# Patient Record
Sex: Male | Born: 1960
Health system: Southern US, Community
[De-identification: ages and names within clinical notes are randomized; demographics above are authoritative.]

## PROBLEM LIST (undated history)

## (undated) DIAGNOSIS — I10 Essential (primary) hypertension: Secondary | ICD-10-CM

## (undated) HISTORY — DX: Essential (primary) hypertension: I10

---

## 2013-10-13 HISTORY — PX: COLONOSCOPY: SHX5424

## 2015-06-13 ENCOUNTER — Ambulatory Visit
Admission: RE | Admit: 2015-06-13 | Discharge: 2015-06-13 | Disposition: A | Discharge status: Home / Self Care | Payer: Worker's Compensation | Source: Ambulatory Visit | Attending: Family Medicine | Admitting: Family Medicine

## 2015-06-13 ENCOUNTER — Other Ambulatory Visit: Payer: Self-pay | Admitting: Family Medicine

## 2015-06-13 DIAGNOSIS — M25571 Pain in right ankle and joints of right foot: Secondary | ICD-10-CM | POA: Diagnosis not present

## 2015-06-13 DIAGNOSIS — M25471 Effusion, right ankle: Secondary | ICD-10-CM | POA: Diagnosis present

## 2015-06-13 DIAGNOSIS — M7731 Calcaneal spur, right foot: Secondary | ICD-10-CM | POA: Diagnosis not present

## 2016-03-25 ENCOUNTER — Ambulatory Visit
Admission: RE | Admit: 2016-03-25 | Discharge: 2016-03-25 | Disposition: A | Discharge status: Home / Self Care | Payer: BLUE CROSS/BLUE SHIELD | Source: Ambulatory Visit | Attending: Family Medicine | Admitting: Family Medicine

## 2016-03-25 ENCOUNTER — Other Ambulatory Visit: Payer: Self-pay | Admitting: Family Medicine

## 2016-03-25 DIAGNOSIS — G629 Polyneuropathy, unspecified: Secondary | ICD-10-CM

## 2016-03-25 DIAGNOSIS — M47892 Other spondylosis, cervical region: Secondary | ICD-10-CM | POA: Insufficient documentation

## 2017-01-21 ENCOUNTER — Inpatient Hospital Stay: Payer: BLUE CROSS/BLUE SHIELD | Attending: Internal Medicine | Admitting: Internal Medicine

## 2017-02-13 ENCOUNTER — Encounter: Payer: Self-pay | Admitting: Internal Medicine

## 2017-03-28 IMAGING — CR DG ANKLE COMPLETE 3+V*R*
3 series · 3 of 3 positions shown · non-contrast
Comparison: None.

CLINICAL DATA: Rolled ankle today.  Lateral pain.

EXAM:
RIGHT ANKLE - COMPLETE 3+ VIEW

[ankle ap]
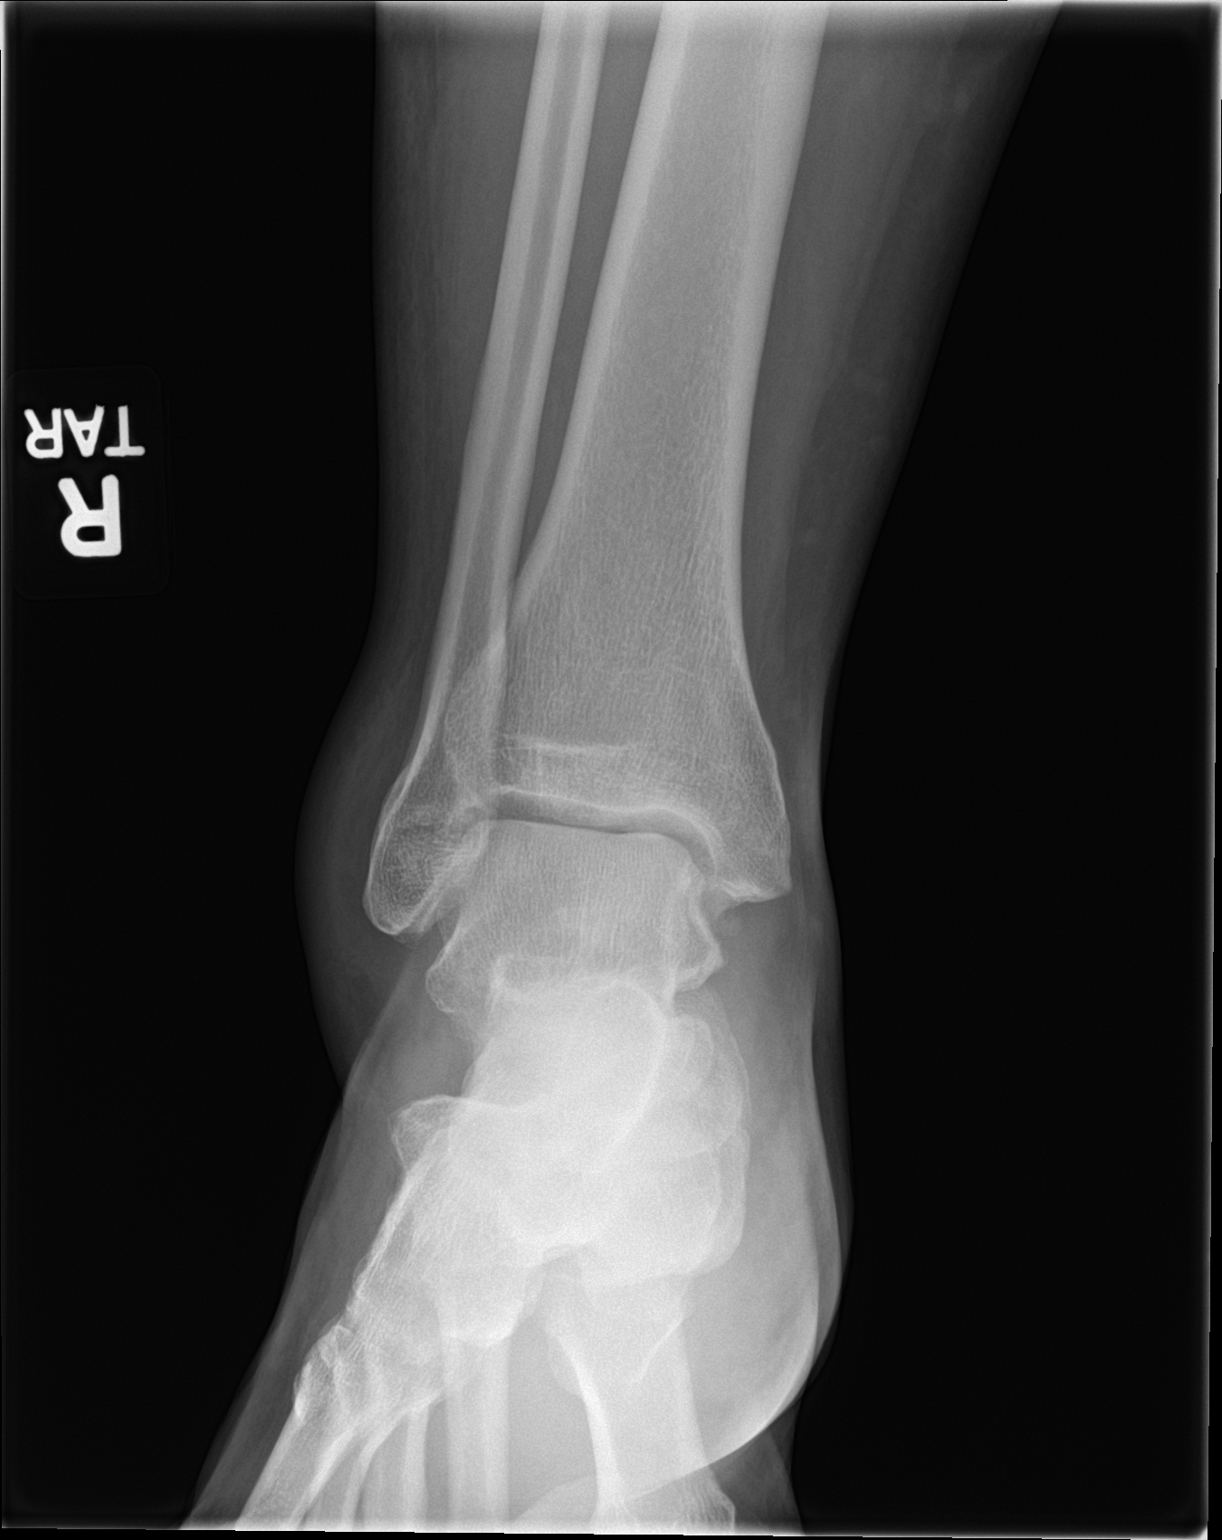

[ankle obl]
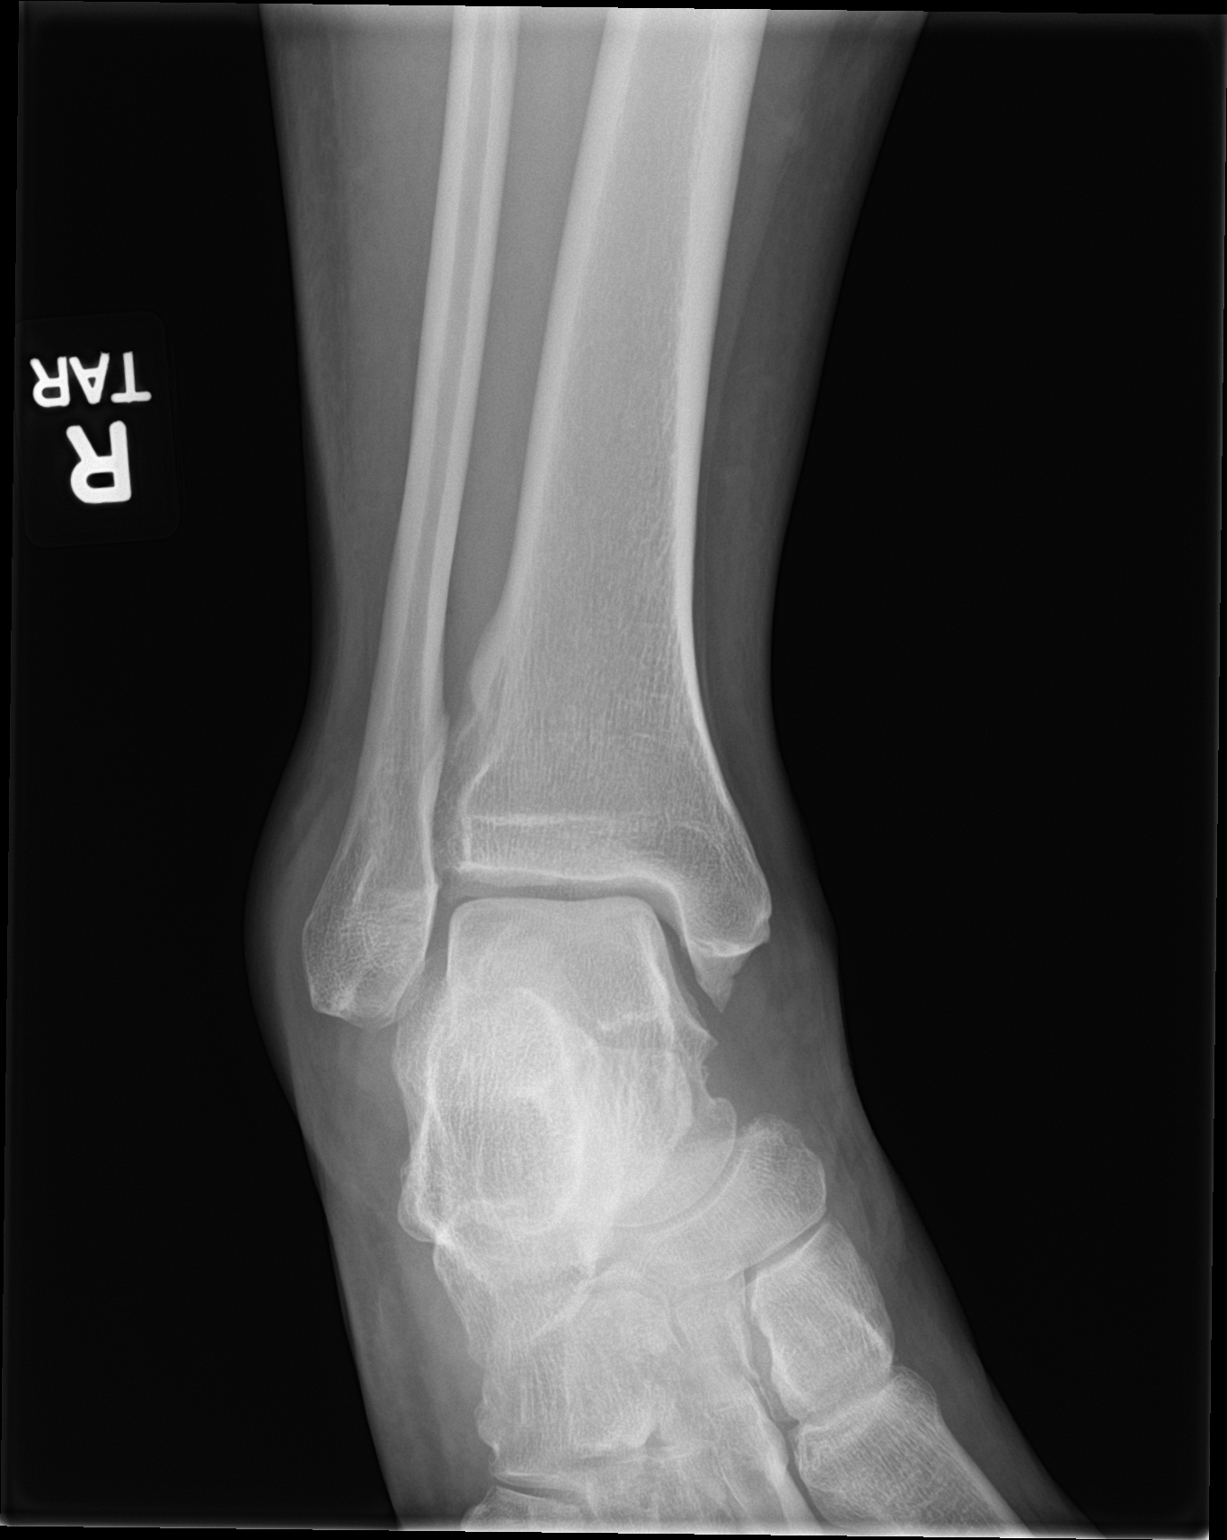

[ankle lat]
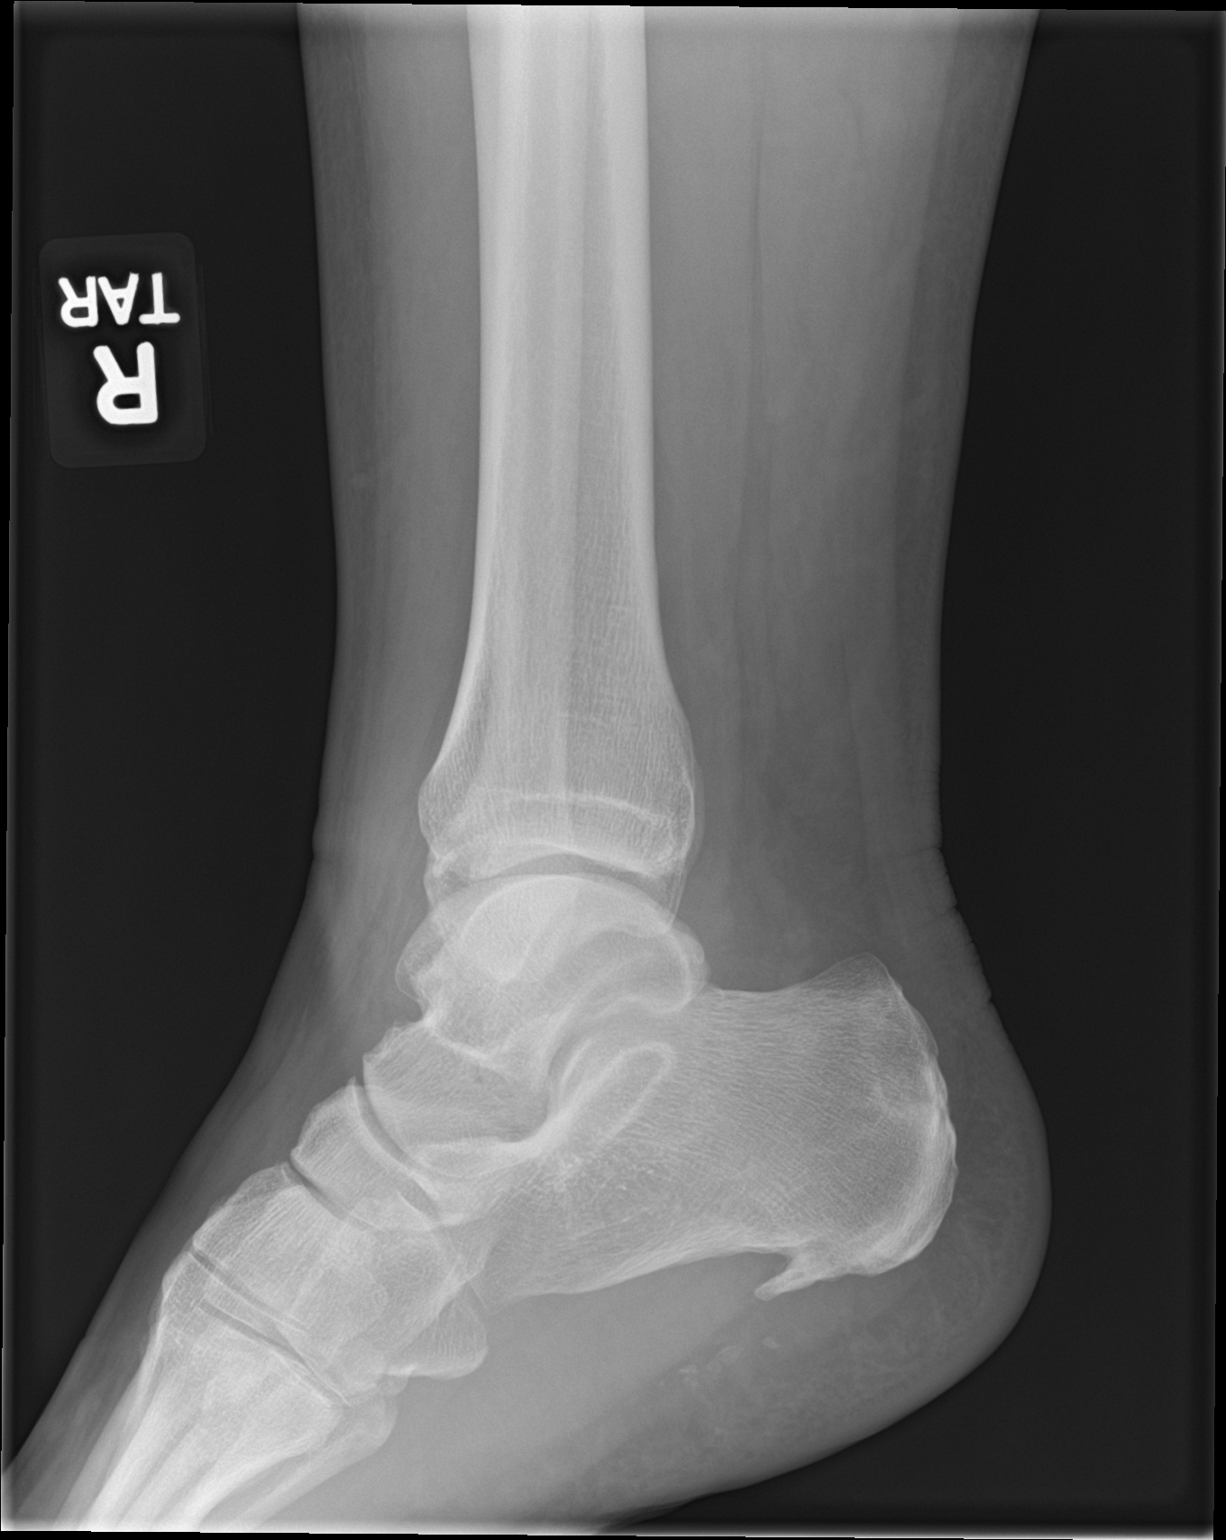

[3 of 3 positions shown; findings below may reference images not displayed]

FINDINGS: Lateral soft tissue swelling. No acute bony abnormality.
Specifically, no fracture, subluxation, or dislocation. Soft tissues
are intact. Plantar calcaneal spur.
IMPRESSION: Lateral soft tissue swelling.  No acute bony abnormality.

## 2017-06-16 DIAGNOSIS — G608 Other hereditary and idiopathic neuropathies: Secondary | ICD-10-CM | POA: Insufficient documentation

## 2018-01-08 IMAGING — CR DG CERVICAL SPINE COMPLETE 4+V
1 series · 6 of 6 positions shown · non-contrast
Comparison: None.

CLINICAL DATA: Neuropathy.  Right hand pain.

EXAM:
CERVICAL SPINE - COMPLETE 4+ VIEW

[Series 1: dg cervical spine complete · 0.14mm/px · 6 of 6 slices shown]
[im 1/6]
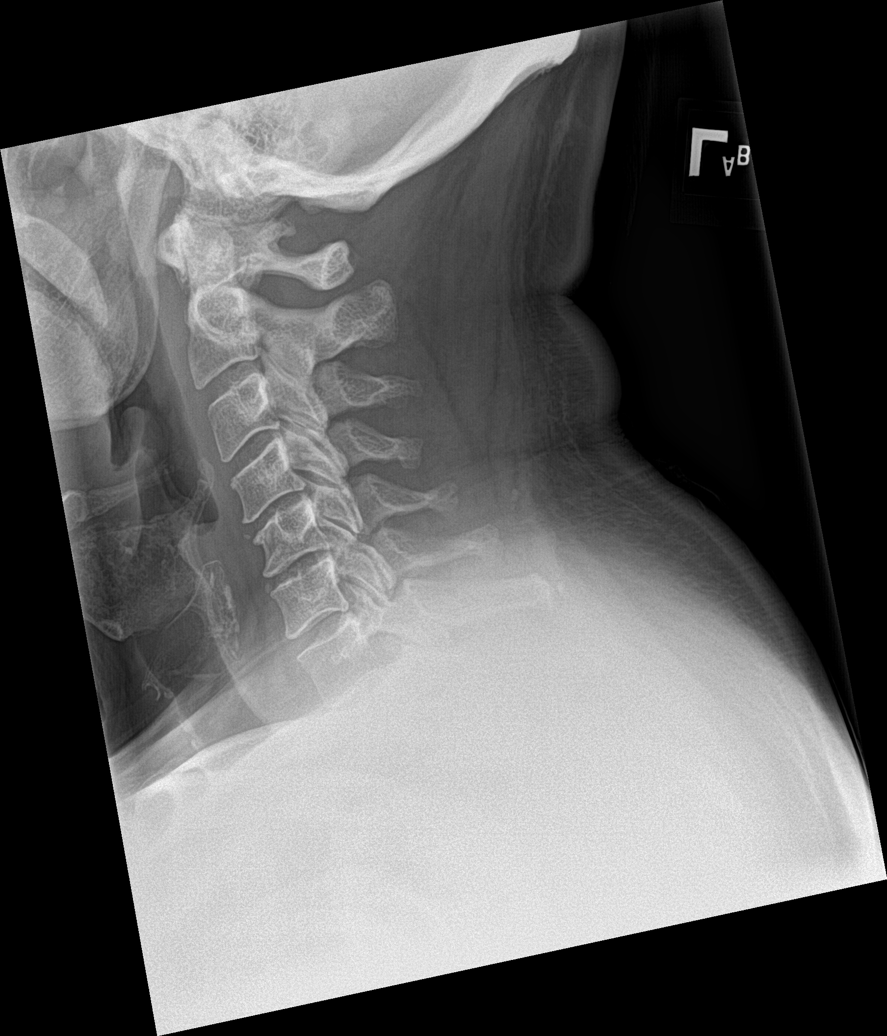
[im 2/6]
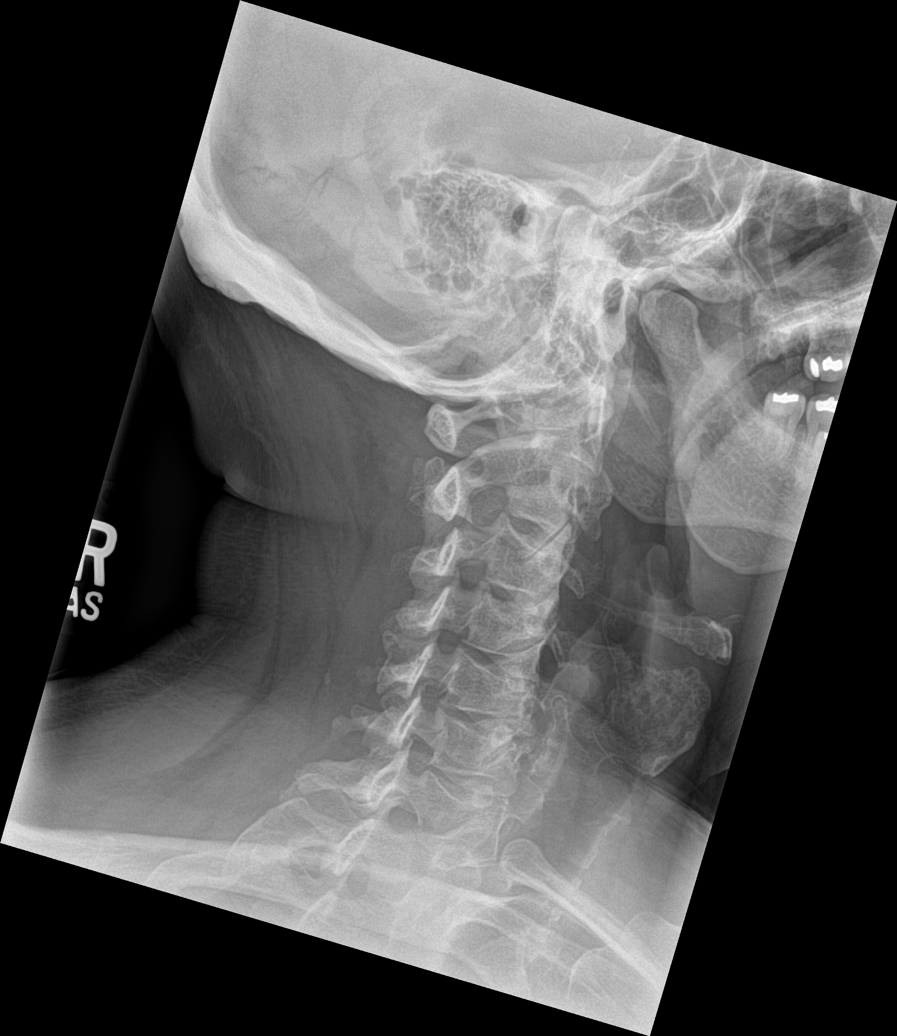
[im 3/6]
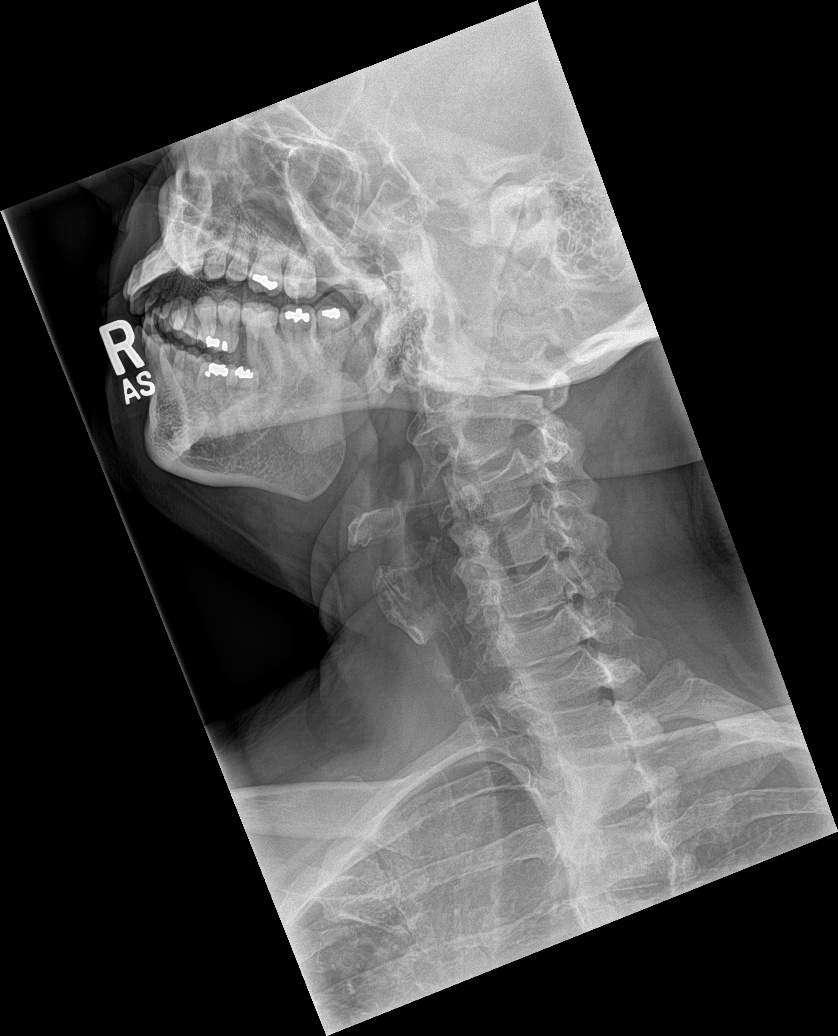
[im 4/6]
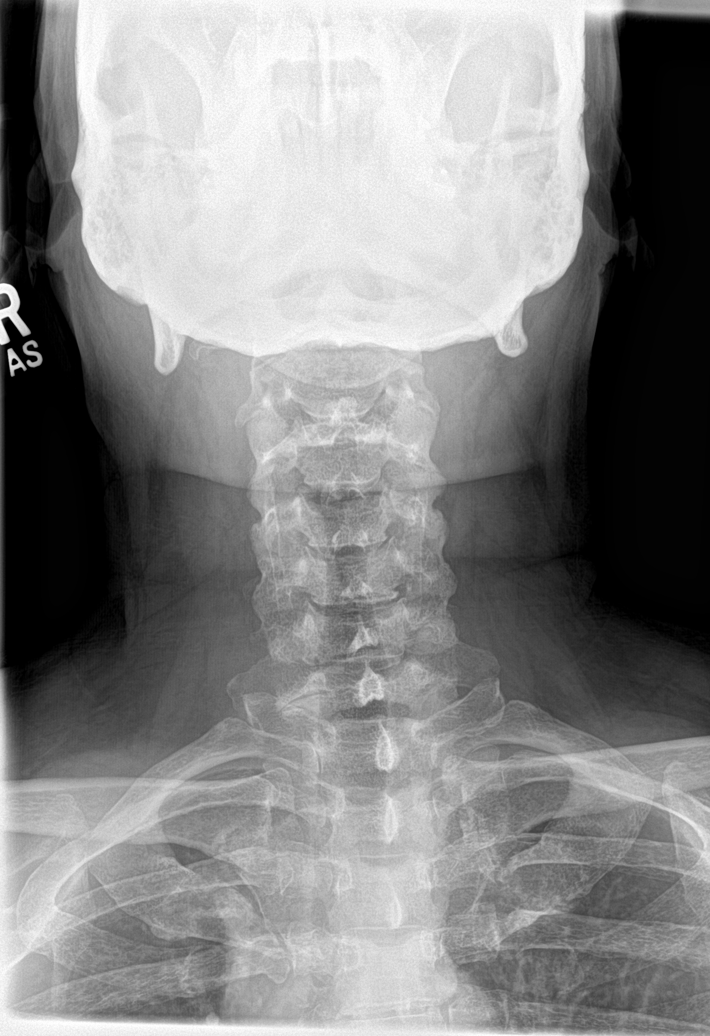
[im 5/6]
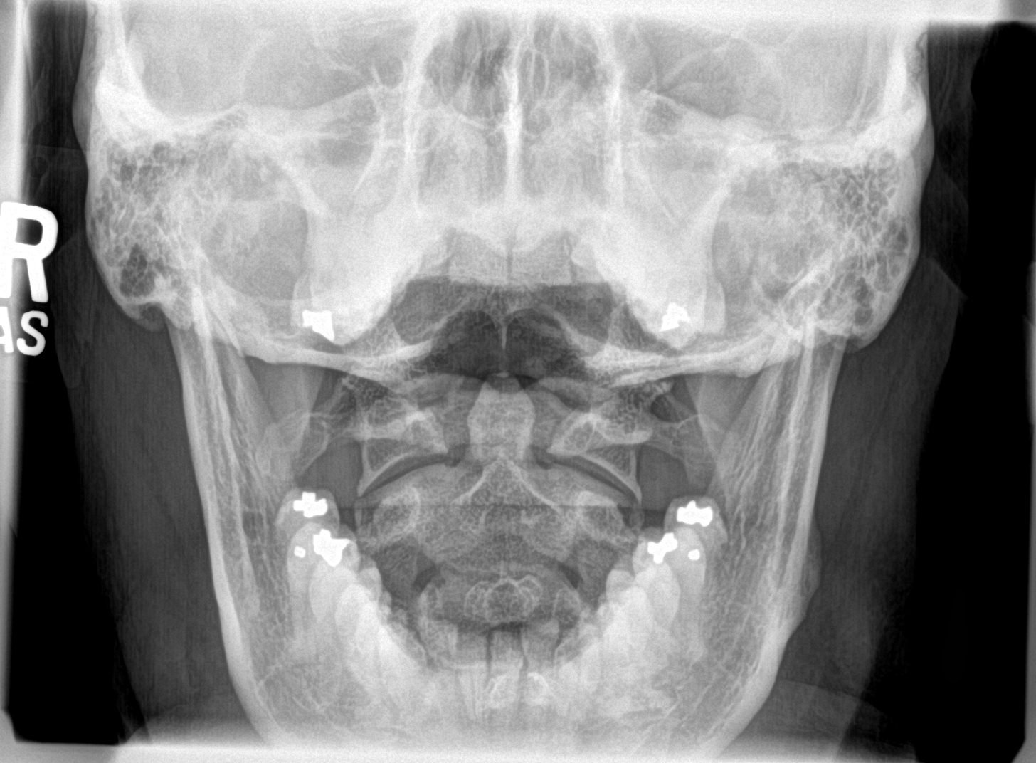
[im 6/6]
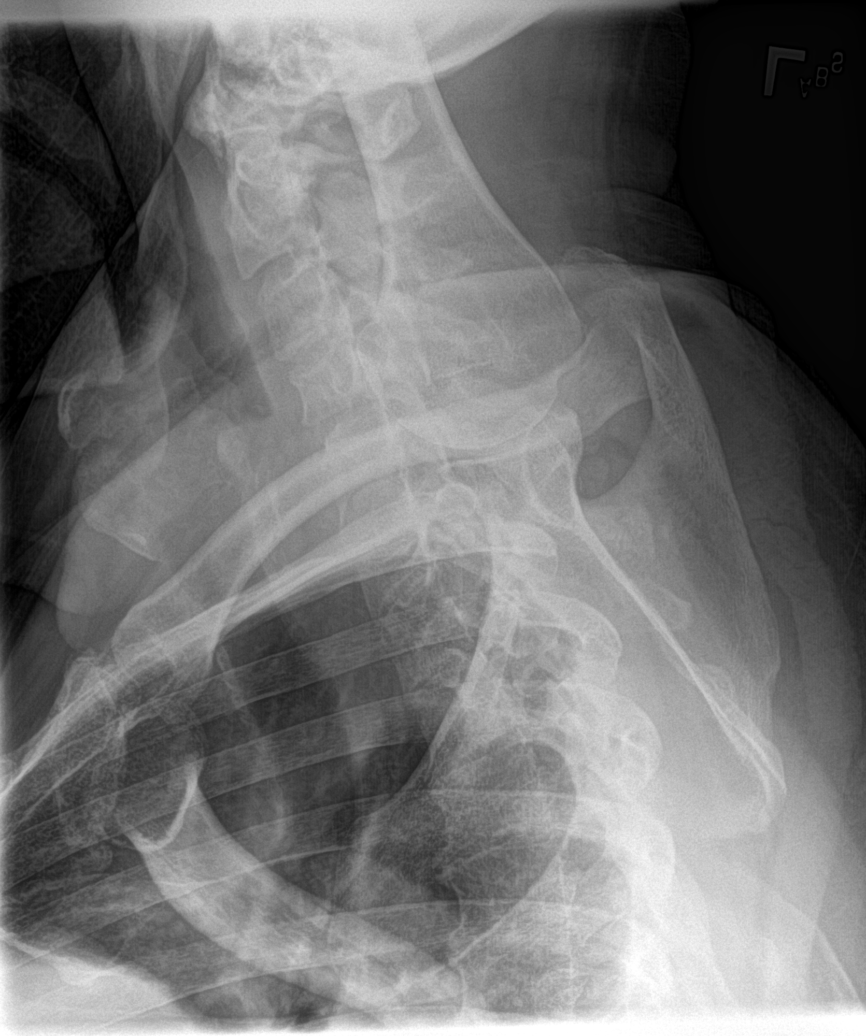

[6 of 6 positions shown; findings below may reference images not displayed]

FINDINGS: Early degenerative disc disease changes at C4-5 and C5-6 with disc
space narrowing and anterior spurring. Normal alignment. No
fracture. Prevertebral soft tissues are normal.
IMPRESSION: Early degenerative changes.  No acute findings.

## 2018-09-01 ENCOUNTER — Other Ambulatory Visit: Payer: Self-pay

## 2018-09-01 MED ORDER — ESZOPICLONE 3 MG PO TABS: 3.0000 mg | ORAL_TABLET | Freq: Every evening | ORAL | 0 refills | Status: DC | PRN

## 2018-09-28 DIAGNOSIS — G608 Other hereditary and idiopathic neuropathies: Secondary | ICD-10-CM | POA: Diagnosis not present

## 2019-02-10 ENCOUNTER — Other Ambulatory Visit: Payer: Self-pay

## 2019-02-10 DIAGNOSIS — G4700 Insomnia, unspecified: Secondary | ICD-10-CM

## 2019-02-10 NOTE — Telephone Encounter (Signed)
Noted  

## 2019-02-10 NOTE — Telephone Encounter (Signed)
Placed call with PT to explain that he would need to be seen before we could refill his Eszopiclone. He hasn't been seen in office since 08/2017. When ask when PT would like to schedule he refused. Stating his reason was fear of covid. I then spoke with Jerelyn Scott NP and explained his concerns, she said that he must be seen in office since he hasn't been here in so long. I then explained to the PT that we would be unable to fill his RX at this time without being seen. I informed him to call back at anytime if he would like to schedule an appt to be seen.

## 2019-02-10 NOTE — Telephone Encounter (Signed)
Joel refused to schedule a visit with a provider at the [facility].  Gerarda Fraction, NP-C (Interim Provider) refused to refill the Rx.  States he needs to come to the clinic every 3 months for face to face visit for controlled substance.  AMD

## 2019-02-10 NOTE — Telephone Encounter (Signed)
Last appt with Dr Cheryll Cockayne physical 08/25/2017 Rx lunesta prn.  Saw Dr Roxan Hockey 05/06/2018 for sinusitis.  Dr Roxan Hockey did medication refill eszopiclone 3mg  po qhs prn #30 Rf0 on 09/01/2018.  Dr Cheryll Cockayne filled prior to that 08/25/2017 #30   Last labs 08/19/2017. Sees neurology at [facility] last appt office 06/03/2017 telemedicine 09/28/2018 no labs drawn since 2018.   Reviewed [place] PMP website 7 Rx previous 2 years by [facility] providers only.  Controlled substances require face to face visits every 3 months.  No controlled substances agreement on file in paper chart.  Patient to schedule appt with provider since Dr Roxan Hockey no longer working in clinic.  Unisom available OTC.

## 2019-03-23 ENCOUNTER — Ambulatory Visit: Payer: Managed Care, Other (non HMO) | Attending: Internal Medicine

## 2019-03-23 DIAGNOSIS — Z20822 Contact with and (suspected) exposure to covid-19: Secondary | ICD-10-CM | POA: Diagnosis not present

## 2019-03-24 ENCOUNTER — Telehealth: Payer: Self-pay | Admitting: *Deleted

## 2019-03-24 NOTE — Telephone Encounter (Signed)
Patient called for results ,still pending also assisted  with my chart

## 2019-03-25 LAB — NOVEL CORONAVIRUS, NAA: SARS-CoV-2, NAA: NOT DETECTED

## 2019-08-26 NOTE — Progress Notes (Signed)
Scheduled to complete physical 09/02/19 with Ron Smith, PA-C.  AMD 

## 2019-08-27 ENCOUNTER — Ambulatory Visit: Payer: Self-pay

## 2019-08-27 ENCOUNTER — Other Ambulatory Visit: Payer: Self-pay

## 2019-08-27 DIAGNOSIS — Z0184 Encounter for antibody response examination: Secondary | ICD-10-CM

## 2019-08-27 DIAGNOSIS — Z0000 Encounter for general adult medical examination without abnormal findings: Secondary | ICD-10-CM

## 2019-08-27 LAB — POCT URINALYSIS DIPSTICK
Bilirubin, UA: NEGATIVE
Blood, UA: NEGATIVE
Glucose, UA: NEGATIVE
Ketones, UA: NEGATIVE
Leukocytes, UA: NEGATIVE
Nitrite, UA: NEGATIVE
Protein, UA: NEGATIVE
Spec Grav, UA: 1.01 (ref 1.010–1.025)
Urobilinogen, UA: 0.2 E.U./dL
pH, UA: 7.5 (ref 5.0–8.0)

## 2019-09-02 ENCOUNTER — Encounter: Payer: Self-pay | Admitting: Physician Assistant

## 2019-09-02 ENCOUNTER — Ambulatory Visit: Payer: Self-pay | Admitting: Physician Assistant

## 2019-09-02 ENCOUNTER — Other Ambulatory Visit: Payer: Self-pay

## 2019-09-02 VITALS — BP 158/100 | HR 78 | Temp 97.9°F | Resp 16 | Ht 70.0 in | Wt 232.0 lb

## 2019-09-02 DIAGNOSIS — Z0000 Encounter for general adult medical examination without abnormal findings: Secondary | ICD-10-CM

## 2019-09-02 MED ORDER — ESZOPICLONE 1 MG PO TABS: 1.0000 mg | ORAL_TABLET | Freq: Every evening | ORAL | 0 refills | Status: DC | PRN

## 2019-09-02 NOTE — Progress Notes (Signed)
   Subjective: Annual physical exam    Patient ID: Marcus Riley, male    DOB: Jul 24, 1960, 59 y.o.   MRN: [id number]  HPI Patient presents for annual physical.  Patient requests refill of Lunesta.   Review of Systems Insomnia    Objective:   Physical Exam No acute distress.  HEENT is unremarkable.  Neck is supple without adenopathy or bruits.  Lungs are clear to auscultation.  Heart regular rate and rhythm.  Abdomen with negative HSM, normoactive bowel sounds, soft, and nontender palpation.  No obvious deformity to the upper or lower extremities.  Patient has free and equal range of motion of the upper and lower extremities.  No obvious deformity to the cervical or lumbar spine.  Patient has full equal range of motion of cervical lumbar spine.  Cranial nerves II through XII grossly intact.      Assessment & Plan: Well exam.  Discussed lab results with patient.  Prescription for Alfonso Patten was renewed.  Follow-up as needed.

## 2019-09-20 LAB — CMP12+LP+TP+TSH+6AC+PSA+CBC…
ALT: 22 IU/L (ref 0–44)
AST: 22 IU/L (ref 0–40)
Albumin/Globulin Ratio: 2.1 (ref 1.2–2.2)
Albumin: 4.4 g/dL (ref 3.8–4.9)
Alkaline Phosphatase: 118 IU/L (ref 48–121)
BUN/Creatinine Ratio: 13 (ref 9–20)
BUN: 12 mg/dL (ref 6–24)
Basophils Absolute: 0 10*3/uL (ref 0.0–0.2)
Basos: 1 %
Bilirubin Total: 0.4 mg/dL (ref 0.0–1.2)
Calcium: 10 mg/dL (ref 8.7–10.2)
Chloride: 104 mmol/L (ref 96–106)
Chol/HDL Ratio: 4.1 ratio (ref 0.0–5.0)
Cholesterol, Total: 181 mg/dL (ref 100–199)
Creatinine, Ser: 0.89 mg/dL (ref 0.76–1.27)
EOS (ABSOLUTE): 0.3 10*3/uL (ref 0.0–0.4)
Eos: 4 %
Estimated CHD Risk: 0.8 times avg. (ref 0.0–1.0)
Free Thyroxine Index: 1.9 (ref 1.2–4.9)
GFR calc Af Amer: 110 mL/min/{1.73_m2} (ref >59)
GFR calc non Af Amer: 95 mL/min/{1.73_m2} (ref >59)
GGT: 56 IU/L (ref 0–65)
Globulin, Total: 2.1 g/dL (ref 1.5–4.5)
Glucose: 93 mg/dL (ref 65–99)
HDL: 44 mg/dL (ref >39)
Hematocrit: 44.8 % (ref 37.5–51.0)
Hemoglobin: 14.9 g/dL (ref 13.0–17.7)
Immature Grans (Abs): 0 10*3/uL (ref 0.0–0.1)
Immature Granulocytes: 0 %
Iron: 122 ug/dL (ref 38–169)
LDH: 156 IU/L (ref 121–224)
LDL Chol Calc (NIH): 118 mg/dL — ABNORMAL HIGH (ref 0–99)
Lymphocytes Absolute: 1.8 10*3/uL (ref 0.7–3.1)
Lymphs: 27 %
MCH: 28.5 pg (ref 26.6–33.0)
MCHC: 33.3 g/dL (ref 31.5–35.7)
MCV: 86 fL (ref 79–97)
Monocytes Absolute: 0.7 10*3/uL (ref 0.1–0.9)
Monocytes: 11 %
Neutrophils Absolute: 3.8 10*3/uL (ref 1.4–7.0)
Neutrophils: 57 %
Phosphorus: 2.8 mg/dL (ref 2.8–4.1)
Platelets: 201 10*3/uL (ref 150–450)
Potassium: 4.4 mmol/L (ref 3.5–5.2)
Prostate Specific Ag, Serum: 4.3 ng/mL — ABNORMAL HIGH (ref 0.0–4.0)
RBC: 5.23 x10E6/uL (ref 4.14–5.80)
RDW: 12.9 % (ref 11.6–15.4)
Sodium: 138 mmol/L (ref 134–144)
T3 Uptake Ratio: 28 % (ref 24–39)
T4, Total: 6.8 ug/dL (ref 4.5–12.0)
TSH: 1.86 u[IU]/mL (ref 0.450–4.500)
Total Protein: 6.5 g/dL (ref 6.0–8.5)
Triglycerides: 102 mg/dL (ref 0–149)
Uric Acid: 6.7 mg/dL (ref 3.8–8.4)
VLDL Cholesterol Cal: 19 mg/dL (ref 5–40)
WBC: 6.7 10*3/uL (ref 3.4–10.8)

## 2019-09-20 LAB — RABIES NEUT.ABS TITRAT.(RFFIT): Rabies Titer - Response: <0.1 IU/mL

## 2019-09-30 DIAGNOSIS — G608 Other hereditary and idiopathic neuropathies: Secondary | ICD-10-CM | POA: Diagnosis not present

## 2019-10-18 ENCOUNTER — Other Ambulatory Visit: Payer: Self-pay

## 2019-10-18 DIAGNOSIS — Z1152 Encounter for screening for COVID-19: Secondary | ICD-10-CM

## 2019-10-18 NOTE — Progress Notes (Signed)
Presents for covid screening.  S/Sx started over the weekend: Body aches Fatigue Slight cough  Has Mychart  AMD

## 2019-10-19 ENCOUNTER — Telehealth: Payer: Self-pay

## 2019-10-19 LAB — NOVEL CORONAVIRUS, NAA: SARS-CoV-2, NAA: NOT DETECTED

## 2019-10-19 LAB — SARS-COV-2, NAA 2 DAY TAT

## 2019-10-19 NOTE — Telephone Encounter (Signed)
Contacted Marcus Riley on home phone number after attempting to contact him on mobile number.  Informed him that covid test results are negative.  AMD

## 2019-12-06 ENCOUNTER — Other Ambulatory Visit: Payer: Self-pay

## 2019-12-06 DIAGNOSIS — Z1152 Encounter for screening for COVID-19: Secondary | ICD-10-CM

## 2019-12-06 NOTE — Progress Notes (Signed)
Presents to [facility] for outside specimen collection for covid testing.  Symtoms started yesterday (12/05/19) Head congestion Sneezing  Coughing  No known exposure  Non-vaccinated  Has mychart  AMD

## 2019-12-07 LAB — SARS-COV-2, NAA 2 DAY TAT

## 2019-12-07 LAB — NOVEL CORONAVIRUS, NAA: SARS-CoV-2, NAA: NOT DETECTED

## 2019-12-08 ENCOUNTER — Other Ambulatory Visit: Payer: Self-pay

## 2019-12-08 DIAGNOSIS — G4700 Insomnia, unspecified: Secondary | ICD-10-CM

## 2019-12-08 MED ORDER — ESZOPICLONE 1 MG PO TABS: 1.0000 mg | ORAL_TABLET | Freq: Every evening | ORAL | 3 refills | Status: DC | PRN

## 2019-12-27 ENCOUNTER — Other Ambulatory Visit: Payer: Self-pay

## 2019-12-27 ENCOUNTER — Ambulatory Visit: Payer: Self-pay

## 2019-12-27 DIAGNOSIS — Z23 Encounter for immunization: Secondary | ICD-10-CM

## 2019-12-27 MED ORDER — RABIES VACCINE, PCEC IM SUSR
1.0000 mL | Freq: Once | INTRAMUSCULAR | Status: AC
  Administered 2019-12-27: 1 mL via INTRAMUSCULAR

## 2019-12-27 NOTE — Progress Notes (Signed)
Julis had Rabies titer on 08/27/19.  Results were <0.1 IU/mL Comment:  Less than 0.1 IU/mL:  Below detection limit >/= 0.1 IU/mL  Documentation obtained from paper chart in [facility] clinic. Guillaume received Rabies pre-exposure vaccine 01/13/12, 01/22/12 & 02/03/12 while working part-time for [employer]. Rabies titer checked 01/27/14 with results of >/= 0.1 IU/mL  (>/= 0.1 IU/mL: Above detection limit but below 0.5 IU/mL)  Presents to clinic today for Rabies booster.  AMD

## 2020-02-02 ENCOUNTER — Telehealth: Payer: Self-pay

## 2020-02-02 NOTE — Telephone Encounter (Signed)
Marcus Riley called today stating that he can't his medication until 02/21/20 per the pharmacy.  Tried to do a PA through [employer] & it said one wasn't required.  Called [employer] at [phone] & spoke with CSR Yvonna Alanis) who said there is a time restriction on medication without a PA.  (30 tabs q 45 days)  Lunesta 1 mg 1 tab po hs Qty 30  Prior Authorization approved for 36 months starting today.  Pharmacy may initiate a Rx  02/02/20 - 02/02/23  ICD code - G47.09 - Insomnia  [employer] ID # [id number] Group # [id number]  AMD

## 2020-02-04 ENCOUNTER — Telehealth: Payer: Self-pay

## 2020-02-04 NOTE — Telephone Encounter (Signed)
Tried to call Marcus Riley to let him know that PA for Lunesta completed two days ago & message sent to him through Mychart.  He didn't answer the call & he doesn't have voice mail set up so unable to leave a call back message.  AMD

## 2020-03-14 ENCOUNTER — Other Ambulatory Visit: Payer: Self-pay

## 2020-03-14 DIAGNOSIS — Z1152 Encounter for screening for COVID-19: Secondary | ICD-10-CM

## 2020-03-16 LAB — NOVEL CORONAVIRUS, NAA: SARS-CoV-2, NAA: NOT DETECTED

## 2020-03-16 LAB — SARS-COV-2, NAA 2 DAY TAT

## 2020-03-20 ENCOUNTER — Other Ambulatory Visit: Payer: Self-pay

## 2020-03-20 ENCOUNTER — Ambulatory Visit: Payer: Self-pay | Admitting: Nurse Practitioner

## 2020-03-20 ENCOUNTER — Encounter: Payer: Self-pay | Admitting: Nurse Practitioner

## 2020-03-20 ENCOUNTER — Other Ambulatory Visit: Payer: Self-pay | Admitting: Nurse Practitioner

## 2020-03-20 VITALS — BP 149/103 | HR 104 | Temp 98.4°F | Resp 16 | Ht 70.0 in | Wt 220.0 lb

## 2020-03-20 DIAGNOSIS — R062 Wheezing: Secondary | ICD-10-CM

## 2020-03-20 DIAGNOSIS — J189 Pneumonia, unspecified organism: Secondary | ICD-10-CM

## 2020-03-20 MED ORDER — ALBUTEROL SULFATE HFA 108 (90 BASE) MCG/ACT IN AERS: 2.0000 | INHALATION_SPRAY | Freq: Four times a day (QID) | RESPIRATORY_TRACT | 0 refills | Status: DC | PRN

## 2020-03-20 MED ORDER — AZITHROMYCIN 250 MG PO TABS: ORAL_TABLET | ORAL | 0 refills | Status: DC

## 2020-03-20 MED ORDER — AMOXICILLIN 500 MG PO CAPS: 1000.0000 mg | ORAL_CAPSULE | Freq: Three times a day (TID) | ORAL | 0 refills | Status: AC

## 2020-03-20 NOTE — Progress Notes (Signed)
Phone call with patient:  Patient states:  Last week head cold COVID PCR at [facility] negative   Cold progressed to chest   Fatigue  Cough + blood in am  Advised patient to come to clinic for appointment today

## 2020-03-20 NOTE — Progress Notes (Signed)
Subjective:    Patient ID: Marcus Riley, male    DOB: [birth year], 60 y.o.   MRN: [id number]  HPI  60 year old male presenting with one week of cold symptoms. Started with head congestion, was tested for COVID at that time (03/14/20) and was negative.   Over the past week congestion has moved more to his chest. He is now coughing up dark mucous that is blood tinged.   Cough is more persistent in the am, he has been using Catering manager cold/flu OTC for support of symptoms   Denies history of respiratory illnesses/denies asthma or use of inhalers in the past   Works in [employer]   Vitals:   03/20/20 1321  BP: (!) 149/103  Pulse: (!) 104  Resp: 16  Temp: 98.4 F (36.9 C)  SpO2: 95%    Repeat BP taken with manual cuff on right arm while patient sitting.  145/88      Review of Systems  Constitutional: Positive for fatigue.  HENT: Positive for congestion.   Respiratory: Positive for cough.   Cardiovascular: Negative.   Genitourinary: Negative.   Psychiatric/Behavioral: Negative.    Past Medical History:  Diagnosis Date  . Hypertension       Objective:   Physical Exam HENT:     Head: Normocephalic.     Right Ear: Tympanic membrane normal.     Left Ear: Tympanic membrane normal.     Nose: Nose normal.  Cardiovascular:     Rate and Rhythm: Normal rate.     Heart sounds: Normal heart sounds.  Pulmonary:     Breath sounds: Examination of the right-upper field reveals wheezing and rhonchi. Examination of the left-upper field reveals wheezing, rhonchi and rales. Examination of the right-middle field reveals rhonchi. Examination of the left-middle field reveals rhonchi and rales. Examination of the left-lower field reveals decreased breath sounds and rales. Decreased breath sounds, wheezing, rhonchi and rales present.     Comments: Inspiratory rales to left lung fields Expiratory wheezing throughout upper lung fields Diminished lung sounds to left  base Scattered rhonchi throughout   No acute distress  Musculoskeletal:     Cervical back: Neck supple.  Neurological:     Mental Status: He is alert.       Recent Results (from the past 2160 hour(s))  Novel Coronavirus, NAA ([facility])     Status: None   Collection Time: 03/14/20  1:48 PM   Specimen: Nasopharyngeal(NP) swabs in vial transport medium   Nasopharynge  Result Value Ref Range   SARS-CoV-2, NAA Not Detected Not Detected    Comment: This nucleic acid amplification test was developed and its performance characteristics determined by [employer]. Nucleic acid amplification tests include RT-PCR and TMA. This test has not been FDA cleared or approved. This test has been authorized by FDA under an Emergency Use Authorization (EUA). This test is only authorized for the duration of time the declaration that circumstances exist justifying the authorization of the emergency use of in vitro diagnostic tests for detection of SARS-CoV-2 virus and/or diagnosis of COVID-19 infection under section 564(b)(1) of the Act, 21 U.S.C. 245YKD-9(I) (1), unless the authorization is terminated or revoked sooner. When diagnostic testing is negative, the possibility of a false negative result should be considered in the context of a patient's recent exposures and the presence of clinical signs and symptoms consistent with COVID-19. An individual without symptoms of COVID-19 and who is not shedding SARS-CoV-2 virus wo uld expect  to have a negative (not detected) result in this assay.   SARS-COV-2, NAA 2 DAY TAT     Status: None   Collection Time: 03/14/20  1:48 PM   Nasopharynge  Result Value Ref Range   SARS-CoV-2, NAA 2 DAY TAT Performed       Assessment & Plan:  F/u 03/23/20 re evaluate BP and respiratory symptoms may send for XRAY at that time if no improvement at that time as discussed with patient  Advised stopping Alka Seltzer, switch to OTC Mucinex to help with mucous  production Push fluids rest RTC earlier with new or worsening symptoms as discussed  Encouraged balanced diet with protein at each meal and eating with antibiotics to prevent stomach upset or diarrhea   Meds ordered this encounter  Medications  . albuterol (VENTOLIN HFA) 108 (90 Base) MCG/ACT inhaler    Sig: Inhale 2 puffs into the lungs every 6 (six) hours as needed for wheezing or shortness of breath (every 4-6 hours as needed. RInse mouth after use).    Dispense:  8 g    Refill:  0  . amoxicillin (AMOXIL) 500 MG capsule    Sig: Take 2 capsules (1,000 mg total) by mouth 3 (three) times daily for 7 days.    Dispense:  42 capsule    Refill:  0  . azithromycin (ZITHROMAX) 250 MG tablet    Sig: Take 2 tablets on day 1 then take one tablet daily on days 2-5. Take with food    Dispense:  6 tablet    Refill:  0

## 2020-03-23 ENCOUNTER — Ambulatory Visit: Payer: Self-pay | Admitting: Physician Assistant

## 2020-03-23 ENCOUNTER — Encounter: Payer: Self-pay | Admitting: Physician Assistant

## 2020-03-23 ENCOUNTER — Other Ambulatory Visit: Payer: Self-pay

## 2020-03-23 VITALS — BP 144/98 | HR 70 | Temp 97.3°F | Resp 16 | Ht 70.0 in | Wt 222.0 lb

## 2020-03-23 DIAGNOSIS — J189 Pneumonia, unspecified organism: Secondary | ICD-10-CM

## 2020-03-23 NOTE — Progress Notes (Signed)
Spoke with Marcus Riley by phone.  Informed him Dr. Fran Lowes left me a know to make a GI referral for him for a Colonoscopy.  Champ states he had a Colonoscopy in 2016 & was supposed to repeat in 2021, but was unable to schedule due to Covid.  States [facility] performed the last one at the [facility] and that [facility] has reached out to him.  Seng states that he doesn't want the GI referral at this time.  Durward Parcel, PA-C & Pricilla Handler, MD both notified.  AMD

## 2020-03-23 NOTE — Patient Instructions (Signed)
Needs f/u colonoscope due to hx

## 2020-03-23 NOTE — Progress Notes (Signed)
Covid test 03/14/20 & reported as negative on 03/16/20.  Recheck from Monday's appt - Dx'd with Pheumonia & Bronchitis Taking Zpack (tomorrow is last dose) & Amoxicillin  Feels some better, but still low energy Coughing fits - especially at night No bloody mucus now Still has some wheezing but feels it's better.  Electronic BP = 160/97 Manual BP = 144/98

## 2020-03-23 NOTE — Progress Notes (Signed)
   Subjective:    Patient ID: Marcus Riley, male    DOB: March 23, 1960, 60 y.o.   MRN: [id number]  HPI 60 year old male a follow-up for acute bronchitis and pneumonia afebrile today having difficulty with sleeping denies cough during the day as a generally stated to be improving x-ray was considered at last visit patient defers x-ray at this time until follow-up examination probably last consider last Covid testing on 12/21 - Pertinent improved appetite reduced cough history denies fever chills   Review of Systems Review of systems unchanged's since 03/21/2019 visit   Medications prior provider Zithromax amoxicillin staggered 5-day in 10-day course History of hypertension reportedly followed by Dr.Shaw which patient would like to keep as primary physician working with him on blood pressure History of tubular adenoma resection in 2015 patient was recommended for follow-up colonoscopy in 3 to 5-year interval is due Objective:   Physical Exam blood pressure 144/98 T 97.3  HEENT examination is unremarkable within normal limits Pulmonary bronchial breath sounds with rhonchi anterior posterior left greater than right, vocal fremitus present with A/E change right middle lobe posteriorly clears with forced expiratory cough, present forced expiratory wheeze without significantly prolonged expiratory phase Cardiac PMI midaxillary line NSR without murmur, ectopy   Abdominal  protuberant bowel sounds present no organomegaly  Extremities unremarkable Neuro deferred exam history of of polyneuropathy followed by neurologist  Assessment & Plan:  Dr. Margart Sickles exam/summary in discussion and consistent with with patient's wishes to defer recommended x-ray will continue current course and await f/u result. He prefers  to maintain therapy with neurologist for blood pressure as well as peripheral neuropathy Discussed with patient to use albuterol inhaler twice daily and as needed if short of breath at night if significant  worsens occurs over weekend to use ER Desires to continue current regimen follow-up Tuesday. He will discuss further need x-ray or medication follow-up depending on condition at that time

## 2020-06-27 ENCOUNTER — Other Ambulatory Visit: Payer: Self-pay

## 2020-06-27 DIAGNOSIS — J069 Acute upper respiratory infection, unspecified: Secondary | ICD-10-CM | POA: Diagnosis not present

## 2020-06-27 DIAGNOSIS — Z1152 Encounter for screening for COVID-19: Secondary | ICD-10-CM

## 2020-06-27 NOTE — Progress Notes (Signed)
Presents to [facility] for outdoor specimen collection for covid test.  Symptomatic since 06/26/20: Cough Congestion Sneezing Runny nose  Non-vaccinated  Has MyChart  AMD

## 2020-06-28 LAB — SARS-COV-2, NAA 2 DAY TAT

## 2020-06-28 LAB — NOVEL CORONAVIRUS, NAA: SARS-CoV-2, NAA: NOT DETECTED

## 2020-08-15 ENCOUNTER — Other Ambulatory Visit: Payer: Self-pay

## 2020-08-15 DIAGNOSIS — Z1152 Encounter for screening for COVID-19: Secondary | ICD-10-CM

## 2020-08-15 LAB — POC COVID19 BINAXNOW: SARS Coronavirus 2 Ag: NEGATIVE

## 2020-08-15 LAB — POCT INFLUENZA A/B
Influenza A, POC: NEGATIVE
Influenza B, POC: NEGATIVE

## 2020-08-15 NOTE — Progress Notes (Signed)
Pt presents today for COVID, symptoms started yesterday 08/14/20, loose bowels, temp, chills and body aches and fatigue. Pt wife just got done with her quarantine. CL,RMA

## 2020-08-16 LAB — NOVEL CORONAVIRUS, NAA: SARS-CoV-2, NAA: NOT DETECTED

## 2020-08-16 LAB — SARS-COV-2, NAA 2 DAY TAT

## 2020-08-31 ENCOUNTER — Other Ambulatory Visit: Payer: Self-pay

## 2020-08-31 ENCOUNTER — Ambulatory Visit: Payer: Self-pay

## 2020-08-31 DIAGNOSIS — Z0000 Encounter for general adult medical examination without abnormal findings: Secondary | ICD-10-CM

## 2020-08-31 LAB — POCT URINALYSIS DIPSTICK
Bilirubin, UA: NEGATIVE
Blood, UA: NEGATIVE
Glucose, UA: NEGATIVE
Ketones, UA: NEGATIVE
Leukocytes, UA: NEGATIVE
Nitrite, UA: NEGATIVE
Protein, UA: NEGATIVE
Spec Grav, UA: 1.015 (ref 1.010–1.025)
Urobilinogen, UA: 0.2 E.U./dL
pH, UA: 6 (ref 5.0–8.0)

## 2020-08-31 NOTE — Progress Notes (Signed)
EKG reviewed by A. Scarboro, NP-C - No changes.  AMD

## 2020-08-31 NOTE — Progress Notes (Signed)
Scheduled to complete physical 09/08/20 with Durward Parcel, PA-C.  AMD

## 2020-09-01 LAB — CMP12+LP+TP+TSH+6AC+PSA+CBC…
ALT: 23 IU/L (ref 0–44)
AST: 26 IU/L (ref 0–40)
Albumin/Globulin Ratio: 2.4 — ABNORMAL HIGH (ref 1.2–2.2)
Albumin: 4.4 g/dL (ref 3.8–4.9)
Alkaline Phosphatase: 100 IU/L (ref 44–121)
BUN/Creatinine Ratio: 14 (ref 9–20)
BUN: 12 mg/dL (ref 6–24)
Basophils Absolute: 0 10*3/uL (ref 0.0–0.2)
Basos: 0 %
Bilirubin Total: 0.5 mg/dL (ref 0.0–1.2)
Calcium: 9.7 mg/dL (ref 8.7–10.2)
Chloride: 99 mmol/L (ref 96–106)
Chol/HDL Ratio: 3.5 ratio (ref 0.0–5.0)
Cholesterol, Total: 163 mg/dL (ref 100–199)
Creatinine, Ser: 0.88 mg/dL (ref 0.76–1.27)
EOS (ABSOLUTE): 0.2 10*3/uL (ref 0.0–0.4)
Eos: 3 %
Estimated CHD Risk: 0.6 times avg. (ref 0.0–1.0)
Free Thyroxine Index: 2 (ref 1.2–4.9)
GGT: 61 IU/L (ref 0–65)
Globulin, Total: 1.8 g/dL (ref 1.5–4.5)
Glucose: 82 mg/dL (ref 65–99)
HDL: 46 mg/dL (ref >39)
Hematocrit: 38.6 % (ref 37.5–51.0)
Hemoglobin: 12.9 g/dL — ABNORMAL LOW (ref 13.0–17.7)
Immature Grans (Abs): 0 10*3/uL (ref 0.0–0.1)
Immature Granulocytes: 0 %
Iron: 115 ug/dL (ref 38–169)
LDH: 155 IU/L (ref 121–224)
LDL Chol Calc (NIH): 100 mg/dL — ABNORMAL HIGH (ref 0–99)
Lymphocytes Absolute: 1.5 10*3/uL (ref 0.7–3.1)
Lymphs: 29 %
MCH: 28.4 pg (ref 26.6–33.0)
MCHC: 33.4 g/dL (ref 31.5–35.7)
MCV: 85 fL (ref 79–97)
Monocytes Absolute: 0.5 10*3/uL (ref 0.1–0.9)
Monocytes: 9 %
Neutrophils Absolute: 3 10*3/uL (ref 1.4–7.0)
Neutrophils: 59 %
Phosphorus: 2.9 mg/dL (ref 2.8–4.1)
Platelets: 183 10*3/uL (ref 150–450)
Potassium: 4.2 mmol/L (ref 3.5–5.2)
Prostate Specific Ag, Serum: 1.4 ng/mL (ref 0.0–4.0)
RBC: 4.55 x10E6/uL (ref 4.14–5.80)
RDW: 13.2 % (ref 11.6–15.4)
Sodium: 134 mmol/L (ref 134–144)
T3 Uptake Ratio: 28 % (ref 24–39)
T4, Total: 7.1 ug/dL (ref 4.5–12.0)
TSH: 1.85 u[IU]/mL (ref 0.450–4.500)
Total Protein: 6.2 g/dL (ref 6.0–8.5)
Triglycerides: 93 mg/dL (ref 0–149)
Uric Acid: 6.2 mg/dL (ref 3.8–8.4)
VLDL Cholesterol Cal: 17 mg/dL (ref 5–40)
WBC: 5.2 10*3/uL (ref 3.4–10.8)
eGFR: 100 mL/min/{1.73_m2} (ref >59)

## 2020-09-08 ENCOUNTER — Ambulatory Visit: Payer: Self-pay | Admitting: Physician Assistant

## 2020-09-08 ENCOUNTER — Encounter: Payer: Self-pay | Admitting: Physician Assistant

## 2020-09-08 ENCOUNTER — Other Ambulatory Visit: Payer: Self-pay

## 2020-09-08 VITALS — BP 158/93 | HR 70 | Temp 97.3°F | Resp 16 | Ht 70.0 in | Wt 224.0 lb

## 2020-09-08 DIAGNOSIS — Z1211 Encounter for screening for malignant neoplasm of colon: Secondary | ICD-10-CM

## 2020-09-08 DIAGNOSIS — Z0000 Encounter for general adult medical examination without abnormal findings: Secondary | ICD-10-CM

## 2020-09-08 DIAGNOSIS — G4700 Insomnia, unspecified: Secondary | ICD-10-CM

## 2020-09-08 DIAGNOSIS — R933 Abnormal findings on diagnostic imaging of other parts of digestive tract: Secondary | ICD-10-CM

## 2020-09-08 MED ORDER — FEXOFENADINE-PSEUDOEPHED ER 60-120 MG PO TB12: 1.0000 | ORAL_TABLET | Freq: Two times a day (BID) | ORAL | 0 refills | Status: DC

## 2020-09-08 MED ORDER — METHYLPREDNISOLONE 4 MG PO TBPK: ORAL_TABLET | ORAL | 0 refills | Status: DC

## 2020-09-08 MED ORDER — ESZOPICLONE 1 MG PO TABS: 1.0000 mg | ORAL_TABLET | Freq: Every evening | ORAL | 3 refills | Status: DC | PRN

## 2020-09-08 NOTE — Addendum Note (Signed)
Addended by: Gardner Candle on: 09/08/2020 04:57 PM   Modules accepted: Orders

## 2020-09-08 NOTE — Progress Notes (Signed)
Feels like he's tasting infection Tx'd for pneumonia 03/2020 Tx'd 06/2020 by [facility] - no ABX, but was prescribed cough meds that he didn't use. Has never been referred to ENT. Denies SOB & chest comfort. States feels like the congestion is his head  Colonoscopy was 09/2013 by [facility] - was supposed to repeat it in 5 years. Is overdue due to Covid.  Reviewed Hep C & HIV CDD recommendations for screening - declined at this time, but will consider with next year's labs for physical  Declined both Covid & Shingles vaccinations.  AMD

## 2020-09-08 NOTE — Progress Notes (Signed)
   Subjective: Annual physical exam    Patient ID: Marcus Riley, male    DOB: 08/18/1960, 60 y.o.   MRN: [id number]  HPI Patient presents for annual physical exam.  Patient was concerned for intermittent sinus congestion.  Patient states also the complaint was after an episode of pneumonia earlier this year.  Patient also requests refill of Lunesta for his insomnia.   Review of Systems Sensorimotor polyneuropathy and insomnia.    Objective:   Physical Exam No acute distress.  Temperature 97.3, pulse is 70, respirations 16, BP is 144/90, and patient is 90% O2 sat on room air. HEENT is unremarkable except for bilateral maxillary and postnasal drainage.  Neck is supple without adenopathy or bruits.  Lungs are clear to auscultation.  Heart is regular rate and rhythm.  EKG shows LAFB. Negative HSM, normoactive bowel sounds, soft, nontender to palpation. No obvious deformity to the upper or lower extremities.  Patient has full and equal range of motion of the upper or lower extremities. No obvious cervical or lumbar spine deformity.  Patient has full and equal range of motion of the cervical and lumbar spine. Cranial nerves II through XII are grossly intact.  DTRs are 2+ without clonus.       Assessment & Plan: Well exam.  Discussed patient lab results.  Patient also has environmental versus seasonal rhinitis.  Patient given a prescription for Allegra-D and Medrol Dosepak.  Patient prescription for Alfonso Patten was refilled.  Patient will follow-up in 7 to 10 days for reevaluation of rhinitis.

## 2020-09-19 ENCOUNTER — Ambulatory Visit: Payer: Self-pay | Admitting: Physician Assistant

## 2020-09-19 ENCOUNTER — Encounter: Payer: Self-pay | Admitting: Physician Assistant

## 2020-09-19 ENCOUNTER — Other Ambulatory Visit: Payer: Self-pay

## 2020-09-19 VITALS — BP 155/101 | HR 74 | Temp 97.6°F | Resp 14 | Ht 70.0 in | Wt 220.0 lb

## 2020-09-19 DIAGNOSIS — R030 Elevated blood-pressure reading, without diagnosis of hypertension: Secondary | ICD-10-CM

## 2020-09-19 MED ORDER — FEXOFENADINE HCL 60 MG PO TABS: 60.0000 mg | ORAL_TABLET | Freq: Two times a day (BID) | ORAL | 0 refills | Status: DC

## 2020-09-19 MED ORDER — AMOXICILLIN 875 MG PO TABS: 875.0000 mg | ORAL_TABLET | Freq: Two times a day (BID) | ORAL | 0 refills | Status: AC

## 2020-09-19 NOTE — Progress Notes (Signed)
Pt stated he completed course of sterioids and didn't help with the "nasal swelling" and has started to have a cough Sunday 09/17/20. CL,RMA

## 2020-09-19 NOTE — Progress Notes (Signed)
   Subjective: Sinus congestion    Patient ID: Marcus Riley, male    DOB: [birth year], 60 y.o.   MRN: [id number]  HPI Patient presents with 2 weeks of sinus congestion.  Complaint was refractory to steroids, antihistamine, decongestant prescribed on his last visit.  Patient states continues to have a postnasal drainage.  Denies fever associated with complaint   Review of Systems Negative except for complaint.    Objective:   Physical Exam No acute distress.  Temperature 97.6, pulse 74, respiration 14, BP is 155/101, and O2 sat is 97% on on room air. HEENT is remarkable postnasal drainage.  Neck is supple without adenopathy or bruits.  Lungs are clear to auscultation.  Heart is regular rate and rhythm.       Assessment & Plan: Subacute maxillary sinusitis and elevated blood pressure.   Patient advised to have a 3-day blood pressure check.  Patient given a prescription for amoxicillin and Allegra.  Patient will follow-up in 1 week.  If no improvement consult to ENT.

## 2020-09-28 DIAGNOSIS — G608 Other hereditary and idiopathic neuropathies: Secondary | ICD-10-CM | POA: Diagnosis not present

## 2020-12-07 DIAGNOSIS — Z86010 Personal history of colonic polyps: Secondary | ICD-10-CM | POA: Diagnosis not present

## 2020-12-11 ENCOUNTER — Other Ambulatory Visit: Payer: Self-pay | Admitting: Physician Assistant

## 2020-12-11 DIAGNOSIS — J020 Streptococcal pharyngitis: Secondary | ICD-10-CM

## 2020-12-11 DIAGNOSIS — Z1152 Encounter for screening for COVID-19: Secondary | ICD-10-CM

## 2020-12-11 LAB — POC COVID19 BINAXNOW: SARS Coronavirus 2 Ag: NEGATIVE

## 2020-12-11 LAB — POCT RAPID STREP A (OFFICE): Rapid Strep A Screen: NEGATIVE

## 2020-12-11 NOTE — Progress Notes (Signed)
Pt experiencing sore throat, chest congestion 12/10/20. RAPID Strep: Negative RAPID COVID: Negative PCR COVID: Pending

## 2020-12-12 LAB — SARS-COV-2, NAA 2 DAY TAT

## 2020-12-12 LAB — NOVEL CORONAVIRUS, NAA: SARS-CoV-2, NAA: NOT DETECTED

## 2020-12-25 ENCOUNTER — Encounter: Payer: Self-pay | Admitting: Physician Assistant

## 2020-12-25 ENCOUNTER — Other Ambulatory Visit: Payer: Self-pay

## 2020-12-25 ENCOUNTER — Ambulatory Visit: Payer: Self-pay | Admitting: Physician Assistant

## 2020-12-25 ENCOUNTER — Telehealth: Payer: Self-pay

## 2020-12-25 VITALS — BP 160/86 | HR 62 | Temp 97.7°F | Resp 14 | Ht 70.0 in | Wt 225.0 lb

## 2020-12-25 DIAGNOSIS — R0981 Nasal congestion: Secondary | ICD-10-CM

## 2020-12-25 DIAGNOSIS — J0100 Acute maxillary sinusitis, unspecified: Secondary | ICD-10-CM

## 2020-12-25 MED ORDER — FEXOFENADINE-PSEUDOEPHED ER 60-120 MG PO TB12: 1.0000 | ORAL_TABLET | Freq: Two times a day (BID) | ORAL | 0 refills | Status: DC

## 2020-12-25 MED ORDER — AMOXICILLIN 875 MG PO TABS: 875.0000 mg | ORAL_TABLET | Freq: Two times a day (BID) | ORAL | 0 refills | Status: AC

## 2020-12-25 NOTE — Progress Notes (Signed)
S/Sx started on 12/10/20 - sore throat & chest congestion Rapid Strep test - Negative; Rapid Covid - Negative; PCR Covid - Negative  Called today stating still having symptoms & requesting to be seen by provider. Also requested ENT referral as previously discussed with Durward Parcel, PA-C. Referral made to Dr. Erline Hau of [facility].  Advised Marcus Riley that ENT office will contact him to schedule appt.  S/Sx: Headache with facial pain/pressure Productive Cough & Nasal Drainage - Grey/Green/Yellow Ears click when blows his nose No teeth discomfort Sore throat - Resolved  OTC Meds: Tried generic Claritin for a couple of days last week, but didn't think it was working & stopped taking it. Has been taking generic guaifenesin tablests (12 hour) with minimal relief.  AMD

## 2020-12-25 NOTE — Telephone Encounter (Signed)
Called clinic requesting ENT Referral. States he & Durward Parcel, PA-C discussed ENT referral at his last visit. Said he feels he has another sinus infection & wants to go ahead & see an ENT specialist.  Referral put in for [facility].  AMD

## 2020-12-25 NOTE — Progress Notes (Signed)
   Subjective: Sinus congestion    Patient ID: Marcus Riley, male    DOB: [birth year], 60 y.o.   MRN: [id number]  HPI Patient presents for over 2 weeks of sinus congestion.  Patient also complain of facial pain, frontal headache, and a thick greenish nasal discharge.  Patient denies fever chills associated complaint.  Patient tested negative for COVID-19.  Patient has taken the vaccine boosters.  No recent travel or known contact with COVID-19.   Review of Systems Seasonal rhinitis    Objective:   Physical Exam No acute distress.  Temperature 97.7, pulse 71, respiration 14, BP is 160/109.  Patient 90% O2 sat on room air       Assessment & Plan:   Patient presents for greater than 2 weeks of sinus congestion, frontal headache, facial pain, and thick greenish nasal drainage.  Patient complaint physical exam consistent with subacute maxillary sinusitis.  Patient given discharge care instruction prescription for amoxicillin and Allegra-D.  Patient will follow-up with schedule ENT appointment next week.

## 2020-12-27 DIAGNOSIS — J309 Allergic rhinitis, unspecified: Secondary | ICD-10-CM | POA: Diagnosis not present

## 2020-12-27 DIAGNOSIS — J301 Allergic rhinitis due to pollen: Secondary | ICD-10-CM | POA: Diagnosis not present

## 2020-12-27 DIAGNOSIS — J329 Chronic sinusitis, unspecified: Secondary | ICD-10-CM | POA: Diagnosis not present

## 2021-01-15 ENCOUNTER — Other Ambulatory Visit: Payer: Self-pay

## 2021-01-15 ENCOUNTER — Encounter: Payer: Self-pay | Admitting: Physician Assistant

## 2021-01-15 ENCOUNTER — Ambulatory Visit: Payer: 59 | Admitting: Physician Assistant

## 2021-01-15 DIAGNOSIS — J069 Acute upper respiratory infection, unspecified: Secondary | ICD-10-CM

## 2021-01-15 DIAGNOSIS — Z1152 Encounter for screening for COVID-19: Secondary | ICD-10-CM

## 2021-01-15 LAB — POC COVID19 BINAXNOW: SARS Coronavirus 2 Ag: NEGATIVE

## 2021-01-15 MED ORDER — PSEUDOEPH-BROMPHEN-DM 30-2-10 MG/5ML PO SYRP: 5.0000 mL | ORAL_SOLUTION | Freq: Four times a day (QID) | ORAL | 0 refills | Status: DC | PRN

## 2021-01-15 MED ORDER — AZITHROMYCIN 250 MG PO TABS: ORAL_TABLET | ORAL | 0 refills | Status: AC

## 2021-01-15 NOTE — Progress Notes (Signed)
   Subjective: Respiratory infection    Patient ID: Marcus Riley, male    DOB: 07-29-1960, 60 y.o.   MRN: [id number]  HPI Patient presents with productive cough and chest congestion.  Patient has a COVID PCR pending.  Denies fever, chills, body aches, or malaise.  Immunizations are up-to-date for COVID and influenza.   Review of Systems Seasonal rhinitis and mix sensory/motor neuropathy.    Objective:   Physical Exam This is a virtual visit.  Review pictures of expectorant with coughing.       Assessment & Plan: Respiratory infection.   Advised quarantine pending PCR results.  Patient given a prescription for Bromfed-DM and Z-Pak.

## 2021-01-15 NOTE — Progress Notes (Signed)
Pt finished round of antibiotics and still coughing up thick yellow mucous. Re-testing for covid.

## 2021-01-18 DIAGNOSIS — J339 Nasal polyp, unspecified: Secondary | ICD-10-CM | POA: Diagnosis not present

## 2021-01-18 DIAGNOSIS — J329 Chronic sinusitis, unspecified: Secondary | ICD-10-CM | POA: Diagnosis not present

## 2021-01-18 LAB — SARS-COV-2, NAA 2 DAY TAT

## 2021-01-18 LAB — NOVEL CORONAVIRUS, NAA: SARS-CoV-2, NAA: NOT DETECTED

## 2021-02-15 HISTORY — PX: COLONOSCOPY: SHX174

## 2021-03-14 DIAGNOSIS — D123 Benign neoplasm of transverse colon: Secondary | ICD-10-CM | POA: Diagnosis not present

## 2021-03-14 DIAGNOSIS — Z86010 Personal history of colonic polyps: Secondary | ICD-10-CM | POA: Diagnosis not present

## 2021-03-14 DIAGNOSIS — K635 Polyp of colon: Secondary | ICD-10-CM | POA: Diagnosis not present

## 2021-03-14 DIAGNOSIS — K648 Other hemorrhoids: Secondary | ICD-10-CM | POA: Diagnosis not present

## 2021-03-14 DIAGNOSIS — D126 Benign neoplasm of colon, unspecified: Secondary | ICD-10-CM | POA: Diagnosis not present

## 2021-03-14 DIAGNOSIS — D122 Benign neoplasm of ascending colon: Secondary | ICD-10-CM | POA: Diagnosis not present

## 2021-04-26 ENCOUNTER — Other Ambulatory Visit: Payer: Self-pay

## 2021-04-26 DIAGNOSIS — G4700 Insomnia, unspecified: Secondary | ICD-10-CM

## 2021-04-26 MED ORDER — ESZOPICLONE 1 MG PO TABS: 1.0000 mg | ORAL_TABLET | Freq: Every evening | ORAL | 3 refills | Status: DC | PRN

## 2021-09-03 ENCOUNTER — Ambulatory Visit: Payer: Self-pay

## 2021-09-03 ENCOUNTER — Other Ambulatory Visit: Payer: Self-pay | Admitting: Physician Assistant

## 2021-09-03 DIAGNOSIS — Z0184 Encounter for antibody response examination: Secondary | ICD-10-CM

## 2021-09-03 DIAGNOSIS — G629 Polyneuropathy, unspecified: Secondary | ICD-10-CM

## 2021-09-03 DIAGNOSIS — Z0000 Encounter for general adult medical examination without abnormal findings: Secondary | ICD-10-CM

## 2021-09-03 LAB — POCT URINALYSIS DIPSTICK
Bilirubin, UA: NEGATIVE
Blood, UA: NEGATIVE
Glucose, UA: NEGATIVE
Ketones, UA: NEGATIVE
Leukocytes, UA: NEGATIVE
Nitrite, UA: NEGATIVE
Protein, UA: NEGATIVE
Spec Grav, UA: 1.015 (ref 1.010–1.025)
Urobilinogen, UA: 0.2 E.U./dL
pH, UA: 8 (ref 5.0–8.0)

## 2021-09-03 NOTE — Progress Notes (Signed)
Pt presents today for physical labs. Will return to clinic for scheduled physical.  Performed by Wandra Feinstein

## 2021-09-04 LAB — CMP12+LP+TP+TSH+6AC+PSA+CBC…
ALT: 22 IU/L (ref 0–44)
AST: 21 IU/L (ref 0–40)
Albumin/Globulin Ratio: 2.3 — ABNORMAL HIGH (ref 1.2–2.2)
Albumin: 4.3 g/dL (ref 3.8–4.9)
Alkaline Phosphatase: 105 IU/L (ref 44–121)
BUN/Creatinine Ratio: 14 (ref 9–20)
BUN: 11 mg/dL (ref 6–24)
Basophils Absolute: 0 10*3/uL (ref 0.0–0.2)
Basos: 1 %
Bilirubin Total: 0.2 mg/dL (ref 0.0–1.2)
Calcium: 10 mg/dL (ref 8.7–10.2)
Chloride: 107 mmol/L — ABNORMAL HIGH (ref 96–106)
Chol/HDL Ratio: 2.9 ratio (ref 0.0–5.0)
Cholesterol, Total: 157 mg/dL (ref 100–199)
Creatinine, Ser: 0.78 mg/dL (ref 0.76–1.27)
EOS (ABSOLUTE): 0.3 10*3/uL (ref 0.0–0.4)
Eos: 6 %
Estimated CHD Risk: <0.5 times avg. (ref 0.0–1.0)
Free Thyroxine Index: 1.6 (ref 1.2–4.9)
GGT: 56 IU/L (ref 0–65)
Globulin, Total: 1.9 g/dL (ref 1.5–4.5)
Glucose: 93 mg/dL (ref 70–99)
HDL: 55 mg/dL (ref >39)
Hematocrit: 42.9 % (ref 37.5–51.0)
Hemoglobin: 14.5 g/dL (ref 13.0–17.7)
Immature Grans (Abs): 0 10*3/uL (ref 0.0–0.1)
Immature Granulocytes: 0 %
Iron: 90 ug/dL (ref 38–169)
LDH: 160 IU/L (ref 121–224)
LDL Chol Calc (NIH): 92 mg/dL (ref 0–99)
Lymphocytes Absolute: 1.5 10*3/uL (ref 0.7–3.1)
Lymphs: 32 %
MCH: 29.1 pg (ref 26.6–33.0)
MCHC: 33.8 g/dL (ref 31.5–35.7)
MCV: 86 fL (ref 79–97)
Monocytes Absolute: 0.5 10*3/uL (ref 0.1–0.9)
Monocytes: 10 %
Neutrophils Absolute: 2.4 10*3/uL (ref 1.4–7.0)
Neutrophils: 51 %
Phosphorus: 2.7 mg/dL — ABNORMAL LOW (ref 2.8–4.1)
Platelets: 173 10*3/uL (ref 150–450)
Potassium: 4.4 mmol/L (ref 3.5–5.2)
Prostate Specific Ag, Serum: 1.1 ng/mL (ref 0.0–4.0)
RBC: 4.99 x10E6/uL (ref 4.14–5.80)
RDW: 12.8 % (ref 11.6–15.4)
Sodium: 140 mmol/L (ref 134–144)
T3 Uptake Ratio: 25 % (ref 24–39)
T4, Total: 6.2 ug/dL (ref 4.5–12.0)
TSH: 2.3 u[IU]/mL (ref 0.450–4.500)
Total Protein: 6.2 g/dL (ref 6.0–8.5)
Triglycerides: 49 mg/dL (ref 0–149)
Uric Acid: 5.8 mg/dL (ref 3.8–8.4)
VLDL Cholesterol Cal: 10 mg/dL (ref 5–40)
WBC: 4.8 10*3/uL (ref 3.4–10.8)
eGFR: 103 mL/min/{1.73_m2} (ref >59)

## 2021-09-05 ENCOUNTER — Ambulatory Visit: Payer: 59 | Admitting: Physician Assistant

## 2021-09-05 ENCOUNTER — Encounter: Payer: Self-pay | Admitting: Physician Assistant

## 2021-09-05 VITALS — BP 159/95 | HR 83 | Temp 97.4°F | Resp 16 | Wt 234.0 lb

## 2021-09-05 DIAGNOSIS — Z0000 Encounter for general adult medical examination without abnormal findings: Secondary | ICD-10-CM

## 2021-09-05 DIAGNOSIS — I10 Essential (primary) hypertension: Secondary | ICD-10-CM

## 2021-09-05 MED ORDER — LISINOPRIL 10 MG PO TABS: 10.0000 mg | ORAL_TABLET | Freq: Every day | ORAL | 3 refills | Status: DC

## 2021-09-05 NOTE — Progress Notes (Signed)
 [facility]  ____________________________________________   None    (approximate)  I have reviewed the triage vital signs and the nursing notes.   HISTORY  Chief Complaint No chief complaint on file.    HPI Marcus Riley is a 61 y.o. male patient presents for physical exam.  Patient was concerned for continued elevated blood pressure.  Patient also complain of joint pain mostly in the wrist, hand, and knees.         Past Medical History:  Diagnosis Date   Hypertension     Patient Active Problem List   Diagnosis Date Noted   Mixed sensory-motor polyneuropathy 06/16/2017    Past Surgical History:  Procedure Laterality Date   COLONOSCOPY N/A 10/13/2013   Note from [facility] says to repeat in 5 years    Prior to Admission medications   Medication Sig Start Date End Date Taking? Authorizing Provider  acetaminophen (TYLENOL) 500 MG tablet Take by mouth.    [provider]  brompheniramine-pseudoephedrine-DM 30-2-10 MG/5ML syrup Take 5 mLs by mouth 4 (four) times daily as needed. 01/15/21   Smith, Ronald K, PA-C  DOCUSATE SODIUM PO Take by mouth.    [provider]  eszopiclone (LUNESTA) 1 MG TABS tablet Take 1 tablet (1 mg total) by mouth at bedtime as needed for sleep. Take immediately before bedtime 04/26/21   Smith, Ronald K, PA-C  famotidine (PEPCID) 10 MG tablet Take 1 tablet by mouth 2 (two) times daily.    [provider]  fexofenadine-pseudoephedrine (ALLEGRA-D) 60-120 MG 12 hr tablet Take 1 tablet by mouth 2 (two) times daily. 12/25/20   Smith, Ronald K, PA-C  gabapentin (NEURONTIN) 300 MG capsule Take by mouth. 09/28/20 12/27/20  [provider]  melatonin 5 MG TABS Take 1 tablet by mouth daily as needed.    [provider]  naproxen sodium (ALEVE) 220 MG tablet Take by mouth.    [provider]    Allergies Eggs or egg-derived products  Family History  Problem Relation Age  of Onset   Asthma Mother     Social History Social History   Tobacco Use   Smoking status: Never   Smokeless tobacco: Never  Substance Use Topics   Alcohol use: Yes    Comment: rare   Drug use: Never    Review of Systems {Constitutional: No fever/chills Eyes: No visual changes. ENT: No sore throat. Cardiovascular: Denies chest pain. Respiratory: Denies shortness of breath. Gastrointestinal: No abdominal pain.  No nausea, no vomiting.  No diarrhea.  No constipation. Genitourinary: Negative for dysuria. Musculoskeletal: Negative for back pain. Skin: Negative for rash. Neurological: Negative for headaches, focal weakness or numbness.  Allergic/Immunilogical: Egg products ____________________________________________   PHYSICAL EXAM:  VITAL SIGNS: BP is 159/95, pulse 83, respirations 16, temperature 97.4, and patient 97% O2 sat on room air.  Patient weighs 234 pounds and BMI is 33.58. Constitutional: Alert and oriented. Well appearing and in no acute distress. Eyes: Conjunctivae are normal. PERRL. EOMI. Head: Atraumatic. Nose: No congestion/rhinnorhea. Mouth/Throat: Mucous membranes are moist.  Oropharynx non-erythematous. Neck: No stridor.  No cervical spine tenderness to palpation. Hematological/Lymphatic/Immunilogical: No cervical lymphadenopathy. Cardiovascular: Normal rate, regular rhythm. Grossly normal heart sounds.  Good peripheral circulation. Respiratory: Normal respiratory effort.  No retractions. Lungs CTAB. Gastrointestinal: Soft and nontender. No distention. No abdominal bruits. No CVA tenderness. Genitourinary: Deferred Musculoskeletal: No lower extremity tenderness nor edema.  No joint effusions. Neurologic:  Normal speech and language. No gross focal   neurologic deficits are appreciated. No gait instability. Skin:  Skin is warm, dry and intact. No rash noted. Psychiatric: Mood and affect are normal. Speech and behavior are  normal.  ____________________________________________   LABS       Component Ref Range & Units 2 d ago 1 yr ago 2 yr ago  Color, UA  yello  yellow  Light Yellow   Clarity, UA  clear  clear  Clear   Glucose, UA Negative Negative  Negative  Negative   Bilirubin, UA  neg  negative  Negative   Ketones, UA  neg  negative  Negative   Spec Grav, UA 1.010 - 1.025 1.015  1.015  1.010   Blood, UA  neg  negative  Negative   pH, UA 5.0 - 8.0 8.0  6.0  7.5   Protein, UA Negative Negative  Negative  Negative   Urobilinogen, UA 0.2 or 1.0 E.U./dL 0.2  0.2  0.2   Nitrite, UA  neg  negative  Negative   Leukocytes, UA Negative Negative  Negative  Negative   Appearance  dark  light     Odor  none                       Other Results from 09/03/2021   Contains abnormal data CMP12+LP+TP+TSH+6AC+PSA+CBC. Order: [id number] Status: Final result    Visible to patient: Yes (not seen)    Next appt: None    Dx: Routine adult health maintenance    0 Result Notes       Component Ref Range & Units 2 d ago 1 yr ago 2 yr ago  Glucose 70 - 99 mg/dL 93  82 R  93 R   Uric Acid 3.8 - 8.4 mg/dL 5.8  6.2 CM  6.7 CM   Comment:            Therapeutic target for gout patients: <6.0  BUN 6 - 24 mg/dL _0 Creatinine, Ser 0.76 - 1.27 mg/dL 0.78  0.88  0.89   eGFR >59 mL/min/1.73 103  100    BUN/Creatinine Ratio 9 - _1 Sodium 134 - 144 mmol/L 140  134  138   Potassium 3.5 - 5.2 mmol/L 4.4  4.2  4.4   Chloride 96 - 106 mmol/L 107 High   99  104   Calcium 8.7 - 10.2 mg/dL 10.0  9.7  10.0   Phosphorus 2.8 - 4.1 mg/dL 2.7 Low   2.9  2.8   Total Protein 6.0 - 8.5 g/dL 6.2  6.2  6.5   Albumin 3.8 - 4.9 g/dL 4.3  4.4  4.4   Globulin, Total 1.5 - 4.5 g/dL 1.9  1.8  2.1   Albumin/Globulin Ratio 1.2 - 2.2 2.3 High   2.4 High   2.1   Bilirubin Total 0.0 - 1.2 mg/dL 0.2  0.5  0.4   Alkaline Phosphatase 44 - 121 IU/L 105  100  118 R   LDH 121 - 224 IU/L 160  155  156   AST 0 - 40 IU/L _2 ALT 0 - 44 IU/L _3 GGT 0 - 65 IU/L 56  61  56   Iron 38 - 169 ug/dL 90  115  122   Cholesterol, Total 100 - 199 mg/dL 157  163  181   Triglycerides 0 -  149 mg/dL 49  93  102   HDL >39 mg/dL 55  46  44   VLDL Cholesterol Cal 5 - 40 mg/dL 10  17  19   LDL Chol Calc (NIH) 0 - 99 mg/dL 92  100 High   118 High    Chol/HDL Ratio 0.0 - 5.0 ratio 2.9  3.5 CM  4.1 CM   Comment:                                   T. Chol/HDL Ratio                                              Men  Women                                1/2 Avg.Risk  3.4    3.3                                    Avg.Risk  5.0    4.4                                 2X Avg.Risk  9.6    7.1                                 3X Avg.Risk 23.4   11.0   Estimated CHD Risk 0.0 - 1.0 times avg.  < 0.5  0.6 CM  0.8 CM   Comment: The CHD Risk is based on the T. Chol/HDL ratio. Other  factors affect CHD Risk such as hypertension, smoking,  diabetes, severe obesity, and family history of  premature CHD.   TSH 0.450 - 4.500 uIU/mL 2.300  1.850  1.860   T4, Total 4.5 - 12.0 ug/dL 6.2  7.1  6.8   T3 Uptake Ratio 24 - 39 % 25  28  28   Free Thyroxine Index 1.2 - 4.9 1.6  2.0  1.9   Prostate Specific Ag, Serum 0.0 - 4.0 ng/mL 1.1  1.4 CM  4.3 High  CM   Comment: Roche ECLIA methodology.  According to the American Urological Association, Serum PSA should  decrease and remain at undetectable levels after radical  prostatectomy. The AUA defines biochemical recurrence as an initial  PSA value 0.2 ng/mL or greater followed by a subsequent confirmatory  PSA value 0.2 ng/mL or greater.  Values obtained with different assay methods or kits cannot be used  interchangeably. Results cannot be interpreted as absolute evidence  of the presence or absence of malignant disease.   WBC 3.4 - 10.8 x10E3/uL 4.8  5.2  6.7   RBC 4.14 - 5.80 x10E6/uL 4.99  4.55  5.23   Hemoglobin 13.0 - 17.7 g/dL 14.5  12.9 Low   14.9   Hematocrit 37.5 - 51.0 % 42.9  38.6   44.8   MCV 79 - 97 fL 86  85  86   MCH 26.6 - 33.0 pg 29.1  28.4  28.5   MCHC 31.5 -   35.7 g/dL 33.8  33.4  33.3   RDW 11.6 - 15.4 % 12.8  13.2  12.9   Platelets 150 - 450 x10E3/uL 173  183  201   Neutrophils Not Estab. % 51  59  57   Lymphs Not Estab. % 32  29  27   Monocytes Not Estab. % _0 Eos Not Estab. % _1 Basos Not Estab. % 1  0  1   Neutrophils Absolute 1.4 - 7.0 x10E3/uL 2.4  3.0  3.8   Lymphocytes Absolute 0.7 - 3.1 x10E3/uL 1.5  1.5  1.8   Monocytes Absolute 0.1 - 0.9 x10E3/uL 0.5  0.5  0.7   EOS (ABSOLUTE) 0.0 - 0.4 x10E3/uL 0.3  0.2  0.3   Basophils Absolute 0.0 - 0.2 x10E3/uL 0.0  0.0  0.0   Immature Granulocytes Not Estab. % 0  0  0   Immature Grans          ____________________________________________  EKG Atrial  Rhythm  P:QRS - 1:1, Abnormal P axis, H Rate 69 -Left axis -anterior fascicular block.   -Anteroseptal infarct -age undetermined.   ABNORMAL   ____________________________________________   ____________________________________________   INITIAL IMPRESSION / ASSESSMENT AND PLAN  As part of my medical decision making, I reviewed the following data within the [facility]       Discussed lab results with patient.  Patient will start lisinopril 10 mg and follow-up 1 week for 3-day blood pressure check.      ____________________________________________   FINAL CLINICAL IMPRESSION  Well exam   ED Discharge Orders     None        Note:  This document was prepared using Dragon voice recognition software and may include unintentional dictation errors.

## 2021-09-05 NOTE — Progress Notes (Signed)
Stated concern of family hx hypertension and knows his is high when checked.  C/o trouble sleeping, denies h/a

## 2021-09-05 NOTE — Addendum Note (Signed)
Addended by: Gardner Candle on: 09/05/2021 03:40 PM   Modules accepted: Orders

## 2021-09-06 LAB — SPECIMEN STATUS REPORT

## 2021-09-06 LAB — ANA W/REFLEX IF POSITIVE: Anti Nuclear Antibody (ANA): NEGATIVE

## 2021-09-06 LAB — RHEUMATOID FACTOR: Rheumatoid fact SerPl-aCnc: <10 IU/mL (ref <14.0)

## 2021-09-19 LAB — SPECIMEN STATUS REPORT

## 2021-09-20 ENCOUNTER — Ambulatory Visit: Payer: Self-pay

## 2021-09-20 VITALS — BP 140/80 | HR 74

## 2021-09-20 DIAGNOSIS — Z0130 Encounter for examination of blood pressure without abnormal findings: Secondary | ICD-10-CM

## 2021-09-20 NOTE — Progress Notes (Signed)
Pt presents today for first BP Check.

## 2021-09-21 ENCOUNTER — Ambulatory Visit: Payer: Self-pay

## 2021-09-21 VITALS — BP 127/84 | HR 78

## 2021-09-21 DIAGNOSIS — Z0130 Encounter for examination of blood pressure without abnormal findings: Secondary | ICD-10-CM

## 2021-09-21 NOTE — Progress Notes (Signed)
Pt presents today for 2nd BP. Pt will call for fu appt .

## 2021-10-02 DIAGNOSIS — R531 Weakness: Secondary | ICD-10-CM | POA: Diagnosis not present

## 2021-10-02 DIAGNOSIS — E559 Vitamin D deficiency, unspecified: Secondary | ICD-10-CM | POA: Diagnosis not present

## 2021-10-02 DIAGNOSIS — R69 Illness, unspecified: Secondary | ICD-10-CM | POA: Diagnosis not present

## 2021-10-02 DIAGNOSIS — G5603 Carpal tunnel syndrome, bilateral upper limbs: Secondary | ICD-10-CM | POA: Diagnosis not present

## 2021-10-02 DIAGNOSIS — M545 Low back pain, unspecified: Secondary | ICD-10-CM | POA: Diagnosis not present

## 2021-10-02 DIAGNOSIS — E538 Deficiency of other specified B group vitamins: Secondary | ICD-10-CM | POA: Diagnosis not present

## 2021-10-02 DIAGNOSIS — G608 Other hereditary and idiopathic neuropathies: Secondary | ICD-10-CM | POA: Diagnosis not present

## 2021-10-02 DIAGNOSIS — R5383 Other fatigue: Secondary | ICD-10-CM | POA: Diagnosis not present

## 2021-10-04 ENCOUNTER — Encounter: Payer: Self-pay | Admitting: Physician Assistant

## 2021-10-04 ENCOUNTER — Ambulatory Visit: Payer: Self-pay | Admitting: Physician Assistant

## 2021-10-04 ENCOUNTER — Other Ambulatory Visit: Payer: Self-pay | Admitting: Student

## 2021-10-04 ENCOUNTER — Other Ambulatory Visit (HOSPITAL_COMMUNITY): Payer: Self-pay | Admitting: Student

## 2021-10-04 VITALS — BP 130/81 | HR 78 | Resp 12 | Ht 70.0 in | Wt 230.0 lb

## 2021-10-04 DIAGNOSIS — I10 Essential (primary) hypertension: Secondary | ICD-10-CM

## 2021-10-04 DIAGNOSIS — G608 Other hereditary and idiopathic neuropathies: Secondary | ICD-10-CM

## 2021-10-04 NOTE — Progress Notes (Signed)
Pt presents today after 2 BP visits. Today is third visit to see if BP medication is needed.Gretel Acre

## 2021-10-04 NOTE — Progress Notes (Signed)
   Subjective: Hypertension    Patient ID: Marcus Riley, male    DOB: 04/22/1960, 61 y.o.   MRN: [id number]  HPI Patient is 1 month follow-up secondary diagnoses hypertension.  Patient was titrated up to 10 mg of lisinopril.  Patient states no side effects and BP readings show an average blood pressure 130/80.  Before starting medication patient was average of blood pressure 160/95.   Review of Systems Negative except for chief complaint    Objective:   Physical Exam BP is 130/81, pulse 78, respiration 12, patient weighs 230 pounds and BMI is 33.00.        Assessment & Plan: Hypertension   Advised continued lisinopril at 10 mg daily.  Follow-up as needed.

## 2021-10-11 ENCOUNTER — Telehealth: Payer: Self-pay

## 2021-10-11 NOTE — Telephone Encounter (Signed)
Rabies titer was collected on 09/03/2021 when Marcus Riley here for labs for physical.  Lab hasn't resulted & shows pending in Epic.  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  [facility] Rep states the wrong test code was used.  It was ordered as #[id number] (Rabies Virus Ab Titer) & it didn't upload in [facility]'s system. Test code should have Been #[id number] (Rabies Ab Titer).  Marcus Riley needs an appointment to have Rabies titer recollected.  AMD

## 2021-11-13 ENCOUNTER — Other Ambulatory Visit: Payer: 59

## 2021-11-14 ENCOUNTER — Other Ambulatory Visit: Payer: Self-pay

## 2021-11-14 DIAGNOSIS — Z0184 Encounter for antibody response examination: Secondary | ICD-10-CM

## 2021-11-14 NOTE — Progress Notes (Signed)
Pt presents today for rabies titer.

## 2021-11-14 NOTE — Addendum Note (Signed)
Addended by: Etheleen Sia on: 11/14/2021 09:29 AM   Modules accepted: Orders

## 2021-11-28 LAB — RABIES NEUT.ABS TITRAT.(RFFIT)

## 2021-12-10 ENCOUNTER — Other Ambulatory Visit: Payer: Self-pay

## 2021-12-10 DIAGNOSIS — G4700 Insomnia, unspecified: Secondary | ICD-10-CM

## 2021-12-10 MED ORDER — ESZOPICLONE 1 MG PO TABS: 1.0000 mg | ORAL_TABLET | Freq: Every evening | ORAL | 5 refills | Status: DC | PRN

## 2022-01-23 DIAGNOSIS — M9902 Segmental and somatic dysfunction of thoracic region: Secondary | ICD-10-CM | POA: Diagnosis not present

## 2022-01-23 DIAGNOSIS — M9901 Segmental and somatic dysfunction of cervical region: Secondary | ICD-10-CM | POA: Diagnosis not present

## 2022-01-23 DIAGNOSIS — M9903 Segmental and somatic dysfunction of lumbar region: Secondary | ICD-10-CM | POA: Diagnosis not present

## 2022-01-23 DIAGNOSIS — M9905 Segmental and somatic dysfunction of pelvic region: Secondary | ICD-10-CM | POA: Diagnosis not present

## 2022-01-25 DIAGNOSIS — M9905 Segmental and somatic dysfunction of pelvic region: Secondary | ICD-10-CM | POA: Diagnosis not present

## 2022-01-25 DIAGNOSIS — M9903 Segmental and somatic dysfunction of lumbar region: Secondary | ICD-10-CM | POA: Diagnosis not present

## 2022-01-25 DIAGNOSIS — M9901 Segmental and somatic dysfunction of cervical region: Secondary | ICD-10-CM | POA: Diagnosis not present

## 2022-01-25 DIAGNOSIS — M9902 Segmental and somatic dysfunction of thoracic region: Secondary | ICD-10-CM | POA: Diagnosis not present

## 2022-02-01 DIAGNOSIS — M9902 Segmental and somatic dysfunction of thoracic region: Secondary | ICD-10-CM | POA: Diagnosis not present

## 2022-02-01 DIAGNOSIS — M9901 Segmental and somatic dysfunction of cervical region: Secondary | ICD-10-CM | POA: Diagnosis not present

## 2022-02-01 DIAGNOSIS — M9903 Segmental and somatic dysfunction of lumbar region: Secondary | ICD-10-CM | POA: Diagnosis not present

## 2022-02-01 DIAGNOSIS — M9905 Segmental and somatic dysfunction of pelvic region: Secondary | ICD-10-CM | POA: Diagnosis not present

## 2022-02-13 DIAGNOSIS — M9902 Segmental and somatic dysfunction of thoracic region: Secondary | ICD-10-CM | POA: Diagnosis not present

## 2022-02-13 DIAGNOSIS — M9905 Segmental and somatic dysfunction of pelvic region: Secondary | ICD-10-CM | POA: Diagnosis not present

## 2022-02-13 DIAGNOSIS — M9903 Segmental and somatic dysfunction of lumbar region: Secondary | ICD-10-CM | POA: Diagnosis not present

## 2022-02-13 DIAGNOSIS — M9901 Segmental and somatic dysfunction of cervical region: Secondary | ICD-10-CM | POA: Diagnosis not present

## 2022-02-15 DIAGNOSIS — M9905 Segmental and somatic dysfunction of pelvic region: Secondary | ICD-10-CM | POA: Diagnosis not present

## 2022-02-15 DIAGNOSIS — M9901 Segmental and somatic dysfunction of cervical region: Secondary | ICD-10-CM | POA: Diagnosis not present

## 2022-02-15 DIAGNOSIS — M9903 Segmental and somatic dysfunction of lumbar region: Secondary | ICD-10-CM | POA: Diagnosis not present

## 2022-02-15 DIAGNOSIS — M9902 Segmental and somatic dysfunction of thoracic region: Secondary | ICD-10-CM | POA: Diagnosis not present

## 2022-02-25 DIAGNOSIS — M9901 Segmental and somatic dysfunction of cervical region: Secondary | ICD-10-CM | POA: Diagnosis not present

## 2022-02-25 DIAGNOSIS — M9902 Segmental and somatic dysfunction of thoracic region: Secondary | ICD-10-CM | POA: Diagnosis not present

## 2022-02-25 DIAGNOSIS — M9905 Segmental and somatic dysfunction of pelvic region: Secondary | ICD-10-CM | POA: Diagnosis not present

## 2022-02-25 DIAGNOSIS — M9903 Segmental and somatic dysfunction of lumbar region: Secondary | ICD-10-CM | POA: Diagnosis not present

## 2022-02-27 DIAGNOSIS — M9905 Segmental and somatic dysfunction of pelvic region: Secondary | ICD-10-CM | POA: Diagnosis not present

## 2022-02-27 DIAGNOSIS — M9902 Segmental and somatic dysfunction of thoracic region: Secondary | ICD-10-CM | POA: Diagnosis not present

## 2022-02-27 DIAGNOSIS — M9903 Segmental and somatic dysfunction of lumbar region: Secondary | ICD-10-CM | POA: Diagnosis not present

## 2022-02-27 DIAGNOSIS — M9901 Segmental and somatic dysfunction of cervical region: Secondary | ICD-10-CM | POA: Diagnosis not present

## 2022-03-08 DIAGNOSIS — M9901 Segmental and somatic dysfunction of cervical region: Secondary | ICD-10-CM | POA: Diagnosis not present

## 2022-03-08 DIAGNOSIS — M9905 Segmental and somatic dysfunction of pelvic region: Secondary | ICD-10-CM | POA: Diagnosis not present

## 2022-03-08 DIAGNOSIS — M9902 Segmental and somatic dysfunction of thoracic region: Secondary | ICD-10-CM | POA: Diagnosis not present

## 2022-03-08 DIAGNOSIS — M9903 Segmental and somatic dysfunction of lumbar region: Secondary | ICD-10-CM | POA: Diagnosis not present

## 2022-03-13 DIAGNOSIS — M9901 Segmental and somatic dysfunction of cervical region: Secondary | ICD-10-CM | POA: Diagnosis not present

## 2022-03-13 DIAGNOSIS — M9903 Segmental and somatic dysfunction of lumbar region: Secondary | ICD-10-CM | POA: Diagnosis not present

## 2022-03-13 DIAGNOSIS — M9905 Segmental and somatic dysfunction of pelvic region: Secondary | ICD-10-CM | POA: Diagnosis not present

## 2022-03-13 DIAGNOSIS — M9902 Segmental and somatic dysfunction of thoracic region: Secondary | ICD-10-CM | POA: Diagnosis not present

## 2022-03-15 DIAGNOSIS — M9901 Segmental and somatic dysfunction of cervical region: Secondary | ICD-10-CM | POA: Diagnosis not present

## 2022-03-15 DIAGNOSIS — M9903 Segmental and somatic dysfunction of lumbar region: Secondary | ICD-10-CM | POA: Diagnosis not present

## 2022-03-15 DIAGNOSIS — M9902 Segmental and somatic dysfunction of thoracic region: Secondary | ICD-10-CM | POA: Diagnosis not present

## 2022-03-15 DIAGNOSIS — M9905 Segmental and somatic dysfunction of pelvic region: Secondary | ICD-10-CM | POA: Diagnosis not present

## 2022-03-19 ENCOUNTER — Other Ambulatory Visit: Payer: Self-pay | Admitting: Physician Assistant

## 2022-03-19 DIAGNOSIS — Z1152 Encounter for screening for COVID-19: Secondary | ICD-10-CM

## 2022-03-19 DIAGNOSIS — R6889 Other general symptoms and signs: Secondary | ICD-10-CM

## 2022-03-19 LAB — POCT INFLUENZA A/B
Influenza A, POC: NEGATIVE
Influenza B, POC: NEGATIVE

## 2022-03-19 LAB — POC COVID19 BINAXNOW: SARS Coronavirus 2 Ag: NEGATIVE

## 2022-03-19 MED ORDER — AZITHROMYCIN 250 MG PO TABS: ORAL_TABLET | ORAL | 0 refills | Status: AC

## 2022-03-19 MED ORDER — PROMETHAZINE-DM 6.25-15 MG/5ML PO SYRP: 5.0000 mL | ORAL_SOLUTION | Freq: Four times a day (QID) | ORAL | 0 refills | Status: DC | PRN

## 2022-03-19 MED ORDER — IBUPROFEN 800 MG PO TABS: 800.0000 mg | ORAL_TABLET | Freq: Three times a day (TID) | ORAL | 0 refills | Status: DC | PRN

## 2022-03-19 NOTE — Progress Notes (Signed)
   Subjective:coug and congestion    Patient ID: Marcus Riley, male    DOB: 1960-07-28, 62 y.o.   MRN: [id number]  HPI Patient complain of nasal/chest congestion, cough, and sore throat for 3 days.  Denies recent travel or known contact with COVID-19.  Patient tested negative for COVID-19 and influenza A/B today.   Review of Systems Negative except for above complaints    Objective:   Physical Exam This is a telephonic encounter       Assessment & Plan: Bronchitis   Advised patient negative test results.  Patient given prescription for Phenergan DM, Z-Pak, and ibuprofen.  Advised warm salt water gargles for sore throat.  Follow-up if no improvement in 48 hours.

## 2022-03-19 NOTE — Progress Notes (Signed)
Pt presents today with congestion, cough and sore throat X 3 days. [facility]. Burna Sis

## 2022-03-22 DIAGNOSIS — M9901 Segmental and somatic dysfunction of cervical region: Secondary | ICD-10-CM | POA: Diagnosis not present

## 2022-03-22 DIAGNOSIS — M9903 Segmental and somatic dysfunction of lumbar region: Secondary | ICD-10-CM | POA: Diagnosis not present

## 2022-03-22 DIAGNOSIS — M9905 Segmental and somatic dysfunction of pelvic region: Secondary | ICD-10-CM | POA: Diagnosis not present

## 2022-03-22 DIAGNOSIS — M9902 Segmental and somatic dysfunction of thoracic region: Secondary | ICD-10-CM | POA: Diagnosis not present

## 2022-04-02 DIAGNOSIS — M9903 Segmental and somatic dysfunction of lumbar region: Secondary | ICD-10-CM | POA: Diagnosis not present

## 2022-04-02 DIAGNOSIS — M9902 Segmental and somatic dysfunction of thoracic region: Secondary | ICD-10-CM | POA: Diagnosis not present

## 2022-04-02 DIAGNOSIS — M9905 Segmental and somatic dysfunction of pelvic region: Secondary | ICD-10-CM | POA: Diagnosis not present

## 2022-04-02 DIAGNOSIS — M9901 Segmental and somatic dysfunction of cervical region: Secondary | ICD-10-CM | POA: Diagnosis not present

## 2022-04-10 ENCOUNTER — Other Ambulatory Visit: Payer: Self-pay | Admitting: Physician Assistant

## 2022-04-10 DIAGNOSIS — N5201 Erectile dysfunction due to arterial insufficiency: Secondary | ICD-10-CM

## 2022-04-12 ENCOUNTER — Other Ambulatory Visit: Payer: Self-pay

## 2022-04-12 DIAGNOSIS — M9901 Segmental and somatic dysfunction of cervical region: Secondary | ICD-10-CM | POA: Diagnosis not present

## 2022-04-12 DIAGNOSIS — M9902 Segmental and somatic dysfunction of thoracic region: Secondary | ICD-10-CM | POA: Diagnosis not present

## 2022-04-12 DIAGNOSIS — N5201 Erectile dysfunction due to arterial insufficiency: Secondary | ICD-10-CM

## 2022-04-12 DIAGNOSIS — M9905 Segmental and somatic dysfunction of pelvic region: Secondary | ICD-10-CM | POA: Diagnosis not present

## 2022-04-12 DIAGNOSIS — R5383 Other fatigue: Secondary | ICD-10-CM

## 2022-04-12 DIAGNOSIS — M9903 Segmental and somatic dysfunction of lumbar region: Secondary | ICD-10-CM | POA: Diagnosis not present

## 2022-04-12 NOTE — Progress Notes (Signed)
Pt presents today for labs per [employer]. Pt

## 2022-04-22 LAB — TESTOSTERONE,FREE AND TOTAL
Testosterone, Free: 5.1 pg/mL — ABNORMAL LOW (ref 7.2–24.0)
Testosterone: 474 ng/dL (ref 264–916)

## 2022-04-22 LAB — VITAMIN D 25 HYDROXY (VIT D DEFICIENCY, FRACTURES): Vit D, 25-Hydroxy: 31.7 ng/mL (ref 30.0–100.0)

## 2022-04-25 ENCOUNTER — Telehealth: Payer: Self-pay

## 2022-04-25 DIAGNOSIS — N5201 Erectile dysfunction due to arterial insufficiency: Secondary | ICD-10-CM

## 2022-04-25 NOTE — Telephone Encounter (Signed)
Pt stated he will call to schedule Urology appt as we sent the referral.

## 2022-04-25 NOTE — Telephone Encounter (Signed)
04/10/2022 - Adolphus called Randel Pigg, PA-C to discuss issues with ED.  Ron ordered labs - Testosterone Free & Total Labs collected on 04/12/2022  Ron ordered a Urology Referral Dx:  ED with low free testosterone.  Referral faxed to [facility].  AMD

## 2022-05-06 DIAGNOSIS — M9901 Segmental and somatic dysfunction of cervical region: Secondary | ICD-10-CM | POA: Diagnosis not present

## 2022-05-06 DIAGNOSIS — M9903 Segmental and somatic dysfunction of lumbar region: Secondary | ICD-10-CM | POA: Diagnosis not present

## 2022-05-06 DIAGNOSIS — M9902 Segmental and somatic dysfunction of thoracic region: Secondary | ICD-10-CM | POA: Diagnosis not present

## 2022-05-06 DIAGNOSIS — M9905 Segmental and somatic dysfunction of pelvic region: Secondary | ICD-10-CM | POA: Diagnosis not present

## 2022-05-10 DIAGNOSIS — M9902 Segmental and somatic dysfunction of thoracic region: Secondary | ICD-10-CM | POA: Diagnosis not present

## 2022-05-10 DIAGNOSIS — M9901 Segmental and somatic dysfunction of cervical region: Secondary | ICD-10-CM | POA: Diagnosis not present

## 2022-05-10 DIAGNOSIS — M9905 Segmental and somatic dysfunction of pelvic region: Secondary | ICD-10-CM | POA: Diagnosis not present

## 2022-05-10 DIAGNOSIS — M9903 Segmental and somatic dysfunction of lumbar region: Secondary | ICD-10-CM | POA: Diagnosis not present

## 2022-05-17 ENCOUNTER — Ambulatory Visit (INDEPENDENT_AMBULATORY_CARE_PROVIDER_SITE_OTHER): Payer: 59 | Admitting: Urology

## 2022-05-17 ENCOUNTER — Encounter: Payer: Self-pay | Admitting: Urology

## 2022-05-17 VITALS — BP 167/105 | HR 76 | Ht 70.0 in

## 2022-05-17 DIAGNOSIS — N529 Male erectile dysfunction, unspecified: Secondary | ICD-10-CM

## 2022-05-17 DIAGNOSIS — Z125 Encounter for screening for malignant neoplasm of prostate: Secondary | ICD-10-CM | POA: Diagnosis not present

## 2022-05-17 DIAGNOSIS — R6882 Decreased libido: Secondary | ICD-10-CM

## 2022-05-17 NOTE — Progress Notes (Signed)
05/17/2022 8:49 AM   Stan Head Gwenlyn Found 04-30-1960 [id number]  Referring provider: Sable Feil, PA-C [address] [place],  [place] [address]  Chief Complaint  Patient presents with   New Patient (Initial Visit)   Erectile Dysfunction    HPI: Marcus Riley is a 62 y.o. male referred for evaluation of erectile dysfunction.  6 month history of difficulty achieving and maintaining an erection. Has partial erections which are firm enough for penetration <50% of time and he does have difficulty maintaining when is able to achieve penetration No pain or curvature with erections Organic risk factors include hypertension and antihypertensive medications.  No prior tobacco use He does complain of decreased libido and has tiredness and fatigue. Has not been tried on a PDE 5 inhibitor because he would prefer not to take a medication Lab work 04/12/2022 remarkable for a normal total testosterone level of 474.  Free testosterone was slightly low at 5.1   PMH: Past Medical History:  Diagnosis Date   Hypertension     Surgical History: Past Surgical History:  Procedure Laterality Date   COLONOSCOPY N/A 10/13/2013   Note from [facility] says to repeat in 5 years    Home Medications:  Allergies as of 05/17/2022       Reactions   Eggs Or Egg-derived Products    Other reaction(s): Unknown        Medication List        Accurate as of May 17, 2022  8:49 AM. If you have any questions, ask your nurse or doctor.          acetaminophen 500 MG tablet Commonly known as: TYLENOL Take by mouth.   DOCUSATE SODIUM PO Take by mouth.   eszopiclone 1 MG Tabs tablet Commonly known as: LUNESTA Take 1 tablet (1 mg total) by mouth at bedtime as needed for sleep. Take immediately before bedtime   famotidine 10 MG tablet Commonly known as: PEPCID Take 1 tablet by mouth 2 (two) times daily.   fluticasone 50 MCG/ACT nasal spray Commonly known as: FLONASE Place 2 sprays into both nostrils  daily.   gabapentin 300 MG capsule Commonly known as: NEURONTIN Take by mouth.   ibuprofen 800 MG tablet Commonly known as: ADVIL Take 1 tablet (800 mg total) by mouth every 8 (eight) hours as needed for moderate pain.   lisinopril 10 MG tablet Commonly known as: ZESTRIL Take 1 tablet by mouth daily.   melatonin 5 MG Tabs Take 1 tablet by mouth daily as needed.   promethazine-dextromethorphan 6.25-15 MG/5ML syrup Commonly known as: PROMETHAZINE-DM Take 5 mLs by mouth 4 (four) times daily as needed for cough. Mix with 5 ml of viscous Lidocaine for swish and swallow        Allergies:  Allergies  Allergen Reactions   Eggs Or Egg-Derived Products     Other reaction(s): Unknown    Family History: Family History  Problem Relation Age of Onset   Asthma Mother     Social History:  reports that he has never smoked. He has never been exposed to tobacco smoke. He has never used smokeless tobacco. He reports current alcohol use. He reports that he does not use drugs.   Physical Exam: BP (!) 167/105   Pulse 76   Ht '5\' 10"'$  (1.778 m)   BMI 33.00 kg/m   Constitutional:  Alert and oriented, No acute distress. HEENT:  AT Respiratory: Normal respiratory effort, no increased work of breathing. GU: Phallus without lesions/plaques.  Testes descended bilateral without masses or tenderness.  Estimated volume 15 cc bilaterally spermatic cord/epididymis palpably normal bilaterally Skin: No rashes, bruises or suspicious lesions. Neurologic: Grossly intact, no focal deficits, moving all 4 extremities. Psychiatric: Normal mood and affect.    Assessment & Plan:    1.  Erectile dysfunction We discussed the majority of erectile dysfunction is secondary to vascular abnormalities and not low testosterone. AUA guidelines for testosterone replacement do not recommend basing on free testosterone level Will repeat free/total testosterone level today along with an estradiol, LH He does not  desire a PDE 5 inhibitor trial at this time and would like to wait on his blood work results  2. Screening for prostate cancer No baseline PSA    Abbie Sons, MD  [facility] [address], [place], [place] [address] [phone]

## 2022-05-22 LAB — ESTRADIOL: Estradiol: 26.2 pg/mL (ref 7.6–42.6)

## 2022-05-22 LAB — TESTOSTERONE,FREE AND TOTAL
Testosterone, Free: 4.2 pg/mL — ABNORMAL LOW (ref 6.6–18.1)
Testosterone: 420 ng/dL (ref 264–916)

## 2022-05-22 LAB — PSA: Prostate Specific Ag, Serum: 1.2 ng/mL (ref 0.0–4.0)

## 2022-05-22 LAB — LUTEINIZING HORMONE: LH: 6.1 m[IU]/mL (ref 1.7–8.6)

## 2022-05-28 DIAGNOSIS — M9901 Segmental and somatic dysfunction of cervical region: Secondary | ICD-10-CM | POA: Diagnosis not present

## 2022-05-28 DIAGNOSIS — M9905 Segmental and somatic dysfunction of pelvic region: Secondary | ICD-10-CM | POA: Diagnosis not present

## 2022-05-28 DIAGNOSIS — M9903 Segmental and somatic dysfunction of lumbar region: Secondary | ICD-10-CM | POA: Diagnosis not present

## 2022-05-28 DIAGNOSIS — M9902 Segmental and somatic dysfunction of thoracic region: Secondary | ICD-10-CM | POA: Diagnosis not present

## 2022-05-31 DIAGNOSIS — M9903 Segmental and somatic dysfunction of lumbar region: Secondary | ICD-10-CM | POA: Diagnosis not present

## 2022-05-31 DIAGNOSIS — M9901 Segmental and somatic dysfunction of cervical region: Secondary | ICD-10-CM | POA: Diagnosis not present

## 2022-05-31 DIAGNOSIS — M9905 Segmental and somatic dysfunction of pelvic region: Secondary | ICD-10-CM | POA: Diagnosis not present

## 2022-05-31 DIAGNOSIS — M9902 Segmental and somatic dysfunction of thoracic region: Secondary | ICD-10-CM | POA: Diagnosis not present

## 2022-06-10 ENCOUNTER — Encounter: Payer: Self-pay | Admitting: Physician Assistant

## 2022-06-10 ENCOUNTER — Ambulatory Visit: Payer: Self-pay | Admitting: Physician Assistant

## 2022-06-10 VITALS — BP 154/98 | HR 87 | Temp 97.3°F | Resp 14 | Wt 225.0 lb

## 2022-06-10 DIAGNOSIS — J0100 Acute maxillary sinusitis, unspecified: Secondary | ICD-10-CM

## 2022-06-10 DIAGNOSIS — I10 Essential (primary) hypertension: Secondary | ICD-10-CM

## 2022-06-10 MED ORDER — FEXOFENADINE-PSEUDOEPHED ER 60-120 MG PO TB12: 1.0000 | ORAL_TABLET | Freq: Two times a day (BID) | ORAL | 0 refills | Status: DC

## 2022-06-10 MED ORDER — LISINOPRIL-HYDROCHLOROTHIAZIDE 10-12.5 MG PO TABS: 1.0000 | ORAL_TABLET | Freq: Every day | ORAL | 3 refills | Status: DC

## 2022-06-10 MED ORDER — AMOXICILLIN 875 MG PO TABS: 875.0000 mg | ORAL_TABLET | Freq: Two times a day (BID) | ORAL | 0 refills | Status: AC

## 2022-06-10 NOTE — Progress Notes (Signed)
   Subjective: Sinus congestion and hypertension    Patient ID: Marcus Riley, male    DOB: [birth year], 62 y.o.   MRN: [id number]  HPI Patient complain of 10 days of sinus congestion, postnasal drainage, and cough.  Patient states the past 3 days developed a foul smell in his oral cavity and a dark green mucus with productive cough.  Denies fever chills associated complaint.  No recent travel or known contact with COVID-19.  Patient also has noticed that his blood pressure is not under control with lisinopril.  Patient states noticed increased blood pressure at work which he believes are stress related.  Denies headache, chest pain, dyspnea.  No cough with medication. Review of Systems Negative except for above complaint    Objective:   Physical Exam  BP is 145/98, pulse 87, respiration 14, temperature 97.3, patient 97% O2 sat on room air. HEENT is remarkable bilateral maxillary guarding.  Postnasal drainage. Neck is supple for lymphadenopathy or bruits. Lungs are clear to auscultation. Heart regular rate and rhythm.      Assessment & Plan: Subacute maxillary sinusitis and hypertension.  Patient given a prescription for amoxicillin and Allegra-D.  Patient also given a prescription Zestoretic 10/12.5.  Patient will follow-up with 3-day blood pressure check in 1 week.

## 2022-06-10 NOTE — Progress Notes (Signed)
About 3 weeks c/o sinus issues off and on with cold/sinus symptoms.  Currently taking Claritin several times per week.  Afebrile, current sinus congestion with cough productive at times, nose back and forth between runny and congested, but no dyspnea or wheeze except with exertion at times.  Stated some expectorant will be green color.  Taking PO adequate.  Stated elevated B/P with stress while working.  He thinks his B/P stays elevated more and kept a record at home and stated never within normal limits even when taking meds as prescribed and wondering about dose changes.

## 2022-06-11 DIAGNOSIS — M9902 Segmental and somatic dysfunction of thoracic region: Secondary | ICD-10-CM | POA: Diagnosis not present

## 2022-06-11 DIAGNOSIS — M9901 Segmental and somatic dysfunction of cervical region: Secondary | ICD-10-CM | POA: Diagnosis not present

## 2022-06-11 DIAGNOSIS — M9905 Segmental and somatic dysfunction of pelvic region: Secondary | ICD-10-CM | POA: Diagnosis not present

## 2022-06-11 DIAGNOSIS — M9903 Segmental and somatic dysfunction of lumbar region: Secondary | ICD-10-CM | POA: Diagnosis not present

## 2022-06-19 ENCOUNTER — Encounter: Payer: Self-pay | Admitting: Physician Assistant

## 2022-06-19 ENCOUNTER — Ambulatory Visit: Payer: Self-pay | Admitting: Physician Assistant

## 2022-06-19 VITALS — BP 132/92 | HR 112 | Resp 12

## 2022-06-19 DIAGNOSIS — I10 Essential (primary) hypertension: Secondary | ICD-10-CM

## 2022-06-19 NOTE — Progress Notes (Signed)
Pt presents today for follow up for BP medication. BP combo with diuretic is to unconfident and uncomfortable. Pt believes the medication is keeping him from a solid void stream. (Very slow voiding) intermittent and frequent urges along with leg cramps.

## 2022-06-19 NOTE — Progress Notes (Signed)
   Subjective: Hypertension    Patient ID: Marcus Riley, male    DOB: 1960-09-18, 62 y.o.   MRN: [id number]  HPI Patient presents for reevaluation of hypertension after starting Zestoretic at 10/12.5 mg.  Patient states cannot tolerate the medication secondary to increased urinary frequency and urgency.  Patient also states when he does void he is not completely emptying his  bladder.  Patient saw urologist on 05/17/2022 for erectile dysfunction.  Patient prostate and PSA were evaluated and found to be normal.  Patient also states having leg cramps since starting medication.  Blood pressure today was 132/92.   Review of Systems Negative except for above complaint.    Objective:   Physical Exam BP was initially 147 over over 98.  Recheck 8 minutes later was 132/92.  Patient is tachycardic at 112.  Respiration is 12. Urinalysis is completely unremarkable.       Assessment & Plan: Side effects of medication  Patient will decrease Zestoretic.  Will start lisinopril 20 mg by taking 2 mg tablets from an old prescription.  Patient will return back in 5 days for 3-day blood pressure check.

## 2022-06-24 ENCOUNTER — Ambulatory Visit: Payer: Self-pay | Admitting: Physician Assistant

## 2022-06-24 VITALS — BP 156/85 | HR 80

## 2022-06-24 DIAGNOSIS — R030 Elevated blood-pressure reading, without diagnosis of hypertension: Secondary | ICD-10-CM

## 2022-06-24 NOTE — Progress Notes (Unsigned)
Pt presents today for 1st BP check. °

## 2022-06-25 ENCOUNTER — Ambulatory Visit: Payer: 59 | Admitting: Physician Assistant

## 2022-06-25 DIAGNOSIS — M9902 Segmental and somatic dysfunction of thoracic region: Secondary | ICD-10-CM | POA: Diagnosis not present

## 2022-06-25 DIAGNOSIS — M9905 Segmental and somatic dysfunction of pelvic region: Secondary | ICD-10-CM | POA: Diagnosis not present

## 2022-06-25 DIAGNOSIS — M9903 Segmental and somatic dysfunction of lumbar region: Secondary | ICD-10-CM | POA: Diagnosis not present

## 2022-06-25 DIAGNOSIS — M9901 Segmental and somatic dysfunction of cervical region: Secondary | ICD-10-CM | POA: Diagnosis not present

## 2022-06-25 NOTE — Progress Notes (Unsigned)
B/P rechecks done.  Automatic cuff used first and then manual B/P completed.  He also is monitoring with his wrist cuff which displayed 160/85.  No c/o voiced and stated Lisinopril was increased last week.  To see provider tomorrow for follow up.

## 2022-06-26 ENCOUNTER — Ambulatory Visit: Payer: Self-pay | Admitting: Physician Assistant

## 2022-06-26 ENCOUNTER — Encounter: Payer: Self-pay | Admitting: Physician Assistant

## 2022-06-26 VITALS — BP 140/88 | HR 74 | Temp 98.0°F | Resp 12 | Ht 70.0 in | Wt 233.0 lb

## 2022-06-26 DIAGNOSIS — I10 Essential (primary) hypertension: Secondary | ICD-10-CM

## 2022-06-26 MED ORDER — LISINOPRIL 20 MG PO TABS: 20.0000 mg | ORAL_TABLET | Freq: Every day | ORAL | 2 refills | Status: DC

## 2022-06-26 NOTE — Progress Notes (Signed)
Has been taking Lisinopril 20 mg since last Thursday

## 2022-06-26 NOTE — Progress Notes (Signed)
   Subjective: Hypertension    Patient ID: Marcus Riley, male    DOB: [birth year], 62 y.o.   MRN: [id number]  HPI Patient follow-up 1 week status post dosage change of lisinopril from 10 mg to 20 mg.  Patient blood pressure has average 124/90 since dosage change last week.  Denies any side effects of medication.  Review of Systems Negative except for above complaint    Objective:   Physical Exam  BP is 140/88, pulse 74, respiration 12, temperature is 98, patient 97% O2 sat on room air.  Patient weighs 233 pounds BMI is 33.54. HEENT is unremarkable. Neck is supple Field bruits. Lungs are clear to auscultation. Heart is regular rate and rhythm.      Assessment & Plan: Hypertension   Continue lisinopril to 20 mg daily.  Follow-up in 6 months.  Sooner if condition worsens.

## 2022-06-27 ENCOUNTER — Encounter: Payer: Self-pay | Admitting: Urology

## 2022-06-27 ENCOUNTER — Ambulatory Visit (INDEPENDENT_AMBULATORY_CARE_PROVIDER_SITE_OTHER): Payer: 59 | Admitting: Urology

## 2022-06-27 VITALS — BP 144/99 | HR 73 | Ht 68.0 in | Wt 233.0 lb

## 2022-06-27 DIAGNOSIS — N529 Male erectile dysfunction, unspecified: Secondary | ICD-10-CM | POA: Diagnosis not present

## 2022-06-27 MED ORDER — SILDENAFIL CITRATE 25 MG PO TABS: ORAL_TABLET | ORAL | 0 refills | Status: DC

## 2022-06-27 NOTE — Progress Notes (Signed)
I, DeAsia L Maxie,acting as a scribe for Riki Altes, MD.,have documented all relevant documentation on the behalf of Riki Altes, MD,as directed by  Riki Altes, MD while in the presence of Riki Altes, MD.   06/27/22 3:33 PM   Dorna Leitz [name] December 21, 1960 [id number]  Referring provider: Joni Reining, PA-C [address] [place],  [place] [address]  Chief Complaint  Patient presents with   Follow-up    HPI: Marcus Riley is a 62 y.o. male who presents for follow-up visit.  Initially seen 05/17/22 for erectile dysfunction. Refer to my note of that date for details. He declined a PDE-5 inhibitor  He had a recheck of labs. His total testosterone level was normal at 420 with a low free testosterone level of 4.2. LH and estradiol levels were normal. At last visit he was reluctant to try a PDE-5 inhibitor. However, he would like a trial of sildenafil but would like to start out at a lower dose. Specifically requested the 25 mg dose. No bothersome LUTS Denies dysuria, gross hematuria Denies flank, abdominal or pelvic pain    PMH: Past Medical History:  Diagnosis Date   Hypertension     Surgical History: Past Surgical History:  Procedure Laterality Date   COLONOSCOPY N/A 10/13/2013   Note from [facility] says to repeat in 5 years    Home Medications:  Allergies as of 06/27/2022       Reactions   Egg-derived Products    Other reaction(s): Unknown        Medication List        Accurate as of June 27, 2022  3:33 PM. If you have any questions, ask your nurse or doctor.          DOCUSATE SODIUM PO Take by mouth.   eszopiclone 1 MG Tabs tablet Commonly known as: LUNESTA Take 1 tablet (1 mg total) by mouth at bedtime as needed for sleep. Take immediately before bedtime   famotidine 10 MG tablet Commonly known as: PEPCID Take 1 tablet by mouth 2 (two) times daily.   fexofenadine-pseudoephedrine 60-120 MG 12 hr tablet Commonly known as: ALLEGRA-D Take 1  tablet by mouth 2 (two) times daily.   fluticasone 50 MCG/ACT nasal spray Commonly known as: FLONASE Place 2 sprays into both nostrils daily.   gabapentin 300 MG capsule Commonly known as: NEURONTIN Take by mouth.   lisinopril 20 MG tablet Commonly known as: ZESTRIL Take 1 tablet (20 mg total) by mouth daily.   melatonin 5 MG Tabs Take 1 tablet by mouth daily as needed.   sildenafil 25 MG tablet Commonly known as: VIAGRA Take 1 tab 1 hour prior to inter course may take up to 4 tabs Started by: Riki Altes, MD        Allergies:  Allergies  Allergen Reactions   Egg-Derived Products     Other reaction(s): Unknown    Family History: Family History  Problem Relation Age of Onset   Asthma Mother     Social History:  reports that he has never smoked. He has never been exposed to tobacco smoke. He has never used smokeless tobacco. He reports current alcohol use. He reports that he does not use drugs.   Physical Exam: BP (!) 144/99   Pulse 73   Ht 5\' 8"  (1.727 m)   Wt 233 lb (105.7 kg)   BMI 35.43 kg/m   Constitutional:  Alert and oriented, No acute distress. HEENT: [name],  moist mucus membranes.  Trachea midline, no masses. Cardiovascular: No clubbing, cyanosis, or edema. Respiratory: Normal respiratory effort, no increased work of breathing. GI: Abdomen is soft, nontender, nondistended, no abdominal masses Skin: No rashes, bruises or suspicious lesions. Neurologic: Grossly intact, no focal deficits, moving all 4 extremities. Psychiatric: Normal mood and affect.  Laboratory Data:  Lab Results  Component Value Date   TESTOSTERONE 420 05/17/2022     Assessment & Plan:    Erectile dysfunction Rx Sildenafil 25 mg sent to pharmacy and he may titrate to a max of 100 mg  Call back regarding efficacy  I have reviewed the above documentation for accuracy and completeness, and I agree with the above.   Riki Altes, MD  [facility] [address], [address] [place], [place] [address] [phone]

## 2022-06-27 NOTE — Progress Notes (Unsigned)
Marcus Riley presents for an office/procedure visit. BP today is higt. He is complaint/noncompliant with BP medication. Greater than 140/90. Provider  notified. Pt advised to keep eye on BP .Pt voiced understanding.

## 2022-06-28 DIAGNOSIS — M9903 Segmental and somatic dysfunction of lumbar region: Secondary | ICD-10-CM | POA: Diagnosis not present

## 2022-06-28 DIAGNOSIS — M9905 Segmental and somatic dysfunction of pelvic region: Secondary | ICD-10-CM | POA: Diagnosis not present

## 2022-06-28 DIAGNOSIS — M9901 Segmental and somatic dysfunction of cervical region: Secondary | ICD-10-CM | POA: Diagnosis not present

## 2022-06-28 DIAGNOSIS — M9902 Segmental and somatic dysfunction of thoracic region: Secondary | ICD-10-CM | POA: Diagnosis not present

## 2022-07-16 DIAGNOSIS — M9905 Segmental and somatic dysfunction of pelvic region: Secondary | ICD-10-CM | POA: Diagnosis not present

## 2022-07-16 DIAGNOSIS — M9901 Segmental and somatic dysfunction of cervical region: Secondary | ICD-10-CM | POA: Diagnosis not present

## 2022-07-16 DIAGNOSIS — M9902 Segmental and somatic dysfunction of thoracic region: Secondary | ICD-10-CM | POA: Diagnosis not present

## 2022-07-16 DIAGNOSIS — M9903 Segmental and somatic dysfunction of lumbar region: Secondary | ICD-10-CM | POA: Diagnosis not present

## 2022-07-19 DIAGNOSIS — M9903 Segmental and somatic dysfunction of lumbar region: Secondary | ICD-10-CM | POA: Diagnosis not present

## 2022-07-19 DIAGNOSIS — M9901 Segmental and somatic dysfunction of cervical region: Secondary | ICD-10-CM | POA: Diagnosis not present

## 2022-07-19 DIAGNOSIS — M9902 Segmental and somatic dysfunction of thoracic region: Secondary | ICD-10-CM | POA: Diagnosis not present

## 2022-07-19 DIAGNOSIS — M9905 Segmental and somatic dysfunction of pelvic region: Secondary | ICD-10-CM | POA: Diagnosis not present

## 2022-08-14 DIAGNOSIS — M9903 Segmental and somatic dysfunction of lumbar region: Secondary | ICD-10-CM | POA: Diagnosis not present

## 2022-08-14 DIAGNOSIS — M9901 Segmental and somatic dysfunction of cervical region: Secondary | ICD-10-CM | POA: Diagnosis not present

## 2022-08-14 DIAGNOSIS — M9902 Segmental and somatic dysfunction of thoracic region: Secondary | ICD-10-CM | POA: Diagnosis not present

## 2022-08-14 DIAGNOSIS — M9905 Segmental and somatic dysfunction of pelvic region: Secondary | ICD-10-CM | POA: Diagnosis not present

## 2022-08-16 DIAGNOSIS — M9903 Segmental and somatic dysfunction of lumbar region: Secondary | ICD-10-CM | POA: Diagnosis not present

## 2022-08-16 DIAGNOSIS — M9905 Segmental and somatic dysfunction of pelvic region: Secondary | ICD-10-CM | POA: Diagnosis not present

## 2022-08-16 DIAGNOSIS — M9901 Segmental and somatic dysfunction of cervical region: Secondary | ICD-10-CM | POA: Diagnosis not present

## 2022-08-16 DIAGNOSIS — M9902 Segmental and somatic dysfunction of thoracic region: Secondary | ICD-10-CM | POA: Diagnosis not present

## 2022-08-19 ENCOUNTER — Ambulatory Visit: Payer: Self-pay

## 2022-08-19 ENCOUNTER — Other Ambulatory Visit: Payer: Self-pay | Admitting: Urology

## 2022-08-19 DIAGNOSIS — Z0000 Encounter for general adult medical examination without abnormal findings: Secondary | ICD-10-CM

## 2022-08-19 LAB — POCT URINALYSIS DIPSTICK
Bilirubin, UA: NEGATIVE
Blood, UA: NEGATIVE
Glucose, UA: NEGATIVE
Ketones, UA: NEGATIVE
Leukocytes, UA: NEGATIVE
Nitrite, UA: NEGATIVE
Protein, UA: NEGATIVE
Spec Grav, UA: 1.015 (ref 1.010–1.025)
Urobilinogen, UA: 0.2 E.U./dL
pH, UA: 7 (ref 5.0–8.0)

## 2022-08-19 NOTE — Progress Notes (Signed)
Pt completed labs for annual physical. 

## 2022-08-20 ENCOUNTER — Other Ambulatory Visit: Payer: Self-pay | Admitting: Urology

## 2022-08-20 DIAGNOSIS — M9905 Segmental and somatic dysfunction of pelvic region: Secondary | ICD-10-CM | POA: Diagnosis not present

## 2022-08-20 DIAGNOSIS — M9903 Segmental and somatic dysfunction of lumbar region: Secondary | ICD-10-CM | POA: Diagnosis not present

## 2022-08-20 DIAGNOSIS — M9901 Segmental and somatic dysfunction of cervical region: Secondary | ICD-10-CM | POA: Diagnosis not present

## 2022-08-20 DIAGNOSIS — M9902 Segmental and somatic dysfunction of thoracic region: Secondary | ICD-10-CM | POA: Diagnosis not present

## 2022-08-20 LAB — CMP12+LP+TP+TSH+6AC+PSA+CBC…
ALT: 29 IU/L (ref 0–44)
AST: 24 IU/L (ref 0–40)
Albumin/Globulin Ratio: 2.2 (ref 1.2–2.2)
Albumin: 4.4 g/dL (ref 3.8–4.9)
Alkaline Phosphatase: 104 IU/L (ref 44–121)
BUN/Creatinine Ratio: 12 (ref 10–24)
BUN: 11 mg/dL (ref 8–27)
Basophils Absolute: 0 10*3/uL (ref 0.0–0.2)
Basos: 1 %
Bilirubin Total: 0.4 mg/dL (ref 0.0–1.2)
Calcium: 10 mg/dL (ref 8.6–10.2)
Chloride: 107 mmol/L — ABNORMAL HIGH (ref 96–106)
Chol/HDL Ratio: 3.7 ratio (ref 0.0–5.0)
Cholesterol, Total: 172 mg/dL (ref 100–199)
Creatinine, Ser: 0.91 mg/dL (ref 0.76–1.27)
EOS (ABSOLUTE): 0.3 10*3/uL (ref 0.0–0.4)
Eos: 5 %
Estimated CHD Risk: 0.6 times avg. (ref 0.0–1.0)
Free Thyroxine Index: 1.8 (ref 1.2–4.9)
GGT: 61 IU/L (ref 0–65)
Globulin, Total: 2 g/dL (ref 1.5–4.5)
Glucose: 91 mg/dL (ref 70–99)
HDL: 46 mg/dL (ref >39)
Hematocrit: 43.3 % (ref 37.5–51.0)
Hemoglobin: 14.3 g/dL (ref 13.0–17.7)
Immature Grans (Abs): 0 10*3/uL (ref 0.0–0.1)
Immature Granulocytes: 0 %
Iron: 172 ug/dL — ABNORMAL HIGH (ref 38–169)
LDH: 165 IU/L (ref 121–224)
LDL Chol Calc (NIH): 109 mg/dL — ABNORMAL HIGH (ref 0–99)
Lymphocytes Absolute: 1.4 10*3/uL (ref 0.7–3.1)
Lymphs: 28 %
MCH: 28.7 pg (ref 26.6–33.0)
MCHC: 33 g/dL (ref 31.5–35.7)
MCV: 87 fL (ref 79–97)
Monocytes Absolute: 0.4 10*3/uL (ref 0.1–0.9)
Monocytes: 8 %
Neutrophils Absolute: 3 10*3/uL (ref 1.4–7.0)
Neutrophils: 58 %
Phosphorus: 2.4 mg/dL — ABNORMAL LOW (ref 2.8–4.1)
Platelets: 177 10*3/uL (ref 150–450)
Potassium: 4.7 mmol/L (ref 3.5–5.2)
Prostate Specific Ag, Serum: 1.6 ng/mL (ref 0.0–4.0)
RBC: 4.99 x10E6/uL (ref 4.14–5.80)
RDW: 12.9 % (ref 11.6–15.4)
Sodium: 140 mmol/L (ref 134–144)
T3 Uptake Ratio: 28 % (ref 24–39)
T4, Total: 6.6 ug/dL (ref 4.5–12.0)
TSH: 1.84 u[IU]/mL (ref 0.450–4.500)
Total Protein: 6.4 g/dL (ref 6.0–8.5)
Triglycerides: 93 mg/dL (ref 0–149)
Uric Acid: 6.7 mg/dL (ref 3.8–8.4)
VLDL Cholesterol Cal: 17 mg/dL (ref 5–40)
WBC: 5.1 10*3/uL (ref 3.4–10.8)
eGFR: 96 mL/min/{1.73_m2} (ref >59)

## 2022-08-21 ENCOUNTER — Telehealth: Payer: Self-pay

## 2022-08-21 NOTE — Telephone Encounter (Signed)
Refill was sent 08/19/22

## 2022-08-21 NOTE — Telephone Encounter (Signed)
LM on triage line requesting refill on Sildenafil.

## 2022-08-22 ENCOUNTER — Encounter: Payer: 59 | Admitting: Physician Assistant

## 2022-08-26 ENCOUNTER — Ambulatory Visit: Payer: Self-pay | Admitting: Physician Assistant

## 2022-08-26 VITALS — BP 160/90 | HR 73 | Temp 97.1°F | Resp 12 | Ht 68.0 in | Wt 232.0 lb

## 2022-08-26 DIAGNOSIS — Z0000 Encounter for general adult medical examination without abnormal findings: Secondary | ICD-10-CM

## 2022-08-26 NOTE — Progress Notes (Signed)
[facility] [address] [place], [place] [address]   Office Visit Note  Patient Name: Marcus Riley Date of Birth [id number]  Medical Record number [id number]  Date of Service: 08/26/2022  Chief Complaint  Patient presents with   Annual Exam     62 y/o M presents for his annual physical. Doing well overall. Compliant with medicines. NO concerns at the present time.       Current Medication:  Outpatient Encounter Medications as of 08/26/2022  Medication Sig Note   DOCUSATE SODIUM PO Take by mouth.    famotidine (PEPCID) 10 MG tablet Take 1 tablet by mouth 2 (two) times daily.    fluticasone (FLONASE) 50 MCG/ACT nasal spray Place 2 sprays into both nostrils daily.    gabapentin (NEURONTIN) 300 MG capsule Take by mouth.    lisinopril (ZESTRIL) 20 MG tablet Take 1 tablet (20 mg total) by mouth daily.    melatonin 5 MG TABS Take 1 tablet by mouth daily as needed.    sildenafil (VIAGRA) 25 MG tablet TAKE 1 TAB 1 HOUR PRIOR TO INTER COURSE MAY TAKE UP TO 4 TABS    eszopiclone (LUNESTA) 1 MG TABS tablet Take 1 tablet (1 mg total) by mouth at bedtime as needed for sleep. Take immediately before bedtime 06/26/2022: Takes prn   fexofenadine-pseudoephedrine (ALLEGRA-D) 60-120 MG 12 hr tablet Take 1 tablet by mouth 2 (two) times daily. (Patient not taking: Reported on 08/26/2022)    No facility-administered encounter medications on file as of 08/26/2022.      Medical History: Past Medical History:  Diagnosis Date   Hypertension      Vital Signs: BP (!) 160/90 (BP Location: Left Arm, Patient Position: Sitting, Cuff Size: Large) Comment: Manual Cuff  Pulse 73   Temp (!) 97.1 F (36.2 C) (Temporal)   Resp 12   Ht 5\' 8"  (1.727 m)   Wt 232 lb (105.2 kg)   SpO2 97%   BMI 35.28 kg/m    Review of Systems  Constitutional: Negative.   HENT: Negative.    Respiratory: Negative.    Cardiovascular: Negative.   Gastrointestinal: Negative.   Genitourinary: Negative.    Musculoskeletal: Negative.   Neurological: Negative.   Psychiatric/Behavioral: Negative.      Physical Exam Constitutional:      Appearance: Normal appearance. He is obese.  HENT:     Head: Normocephalic and atraumatic.     Right Ear: Tympanic membrane, ear canal and external ear normal.     Left Ear: Tympanic membrane, ear canal and external ear normal.     Nose: Nose normal.     Mouth/Throat:     Mouth: Mucous membranes are moist.     Pharynx: Oropharynx is clear.  Eyes:     Extraocular Movements: Extraocular movements intact.     Pupils: Pupils are equal, round, and reactive to light.  Cardiovascular:     Rate and Rhythm: Normal rate and regular rhythm.  Pulmonary:     Effort: Pulmonary effort is normal.     Breath sounds: Normal breath sounds.  Abdominal:     General: Bowel sounds are normal.     Palpations: Abdomen is soft.     Tenderness: There is no abdominal tenderness.  Musculoskeletal:        General: Normal range of motion.     Cervical back: Normal range of motion and neck supple.  Skin:    General: Skin is warm and dry.  Neurological:  General: No focal deficit present.     Mental Status: He is alert and oriented to person, place, and time.  Psychiatric:        Mood and Affect: Mood normal.        Behavior: Behavior normal.        Thought Content: Thought content normal.       Assessment/Plan:  1. Routine adult health maintenance Reviewed blood work results.  Low salt diet.  Continue to monitor BP at home.  RTC in 1 year or sooner if any difficulty arises.  General Counseling: Keilen verbalizes understanding of the findings of todays visit and agrees with plan of treatment. I have discussed any further diagnostic evaluation that may be needed or ordered today. We also reviewed his medications today. he has been encouraged to call the office with any questions or concerns that should arise related to todays visit.    Time spent:20  Minutes    Gilberto Better, [place] Physician Assistant

## 2022-08-26 NOTE — Progress Notes (Signed)
Requesting Rx refill for [facility]

## 2022-08-27 ENCOUNTER — Other Ambulatory Visit: Payer: Self-pay

## 2022-08-27 ENCOUNTER — Telehealth: Payer: Self-pay | Admitting: Urology

## 2022-08-27 ENCOUNTER — Other Ambulatory Visit: Payer: Self-pay | Admitting: *Deleted

## 2022-08-27 DIAGNOSIS — G4700 Insomnia, unspecified: Secondary | ICD-10-CM

## 2022-08-27 MED ORDER — ESZOPICLONE 1 MG PO TABS
1.0000 mg | ORAL_TABLET | Freq: Every evening | ORAL | 5 refills | Status: DC | PRN
Start: 2022-08-27 — End: 2023-06-30

## 2022-08-27 MED ORDER — SILDENAFIL CITRATE 25 MG PO TABS: ORAL_TABLET | ORAL | 0 refills | Status: DC

## 2022-08-27 NOTE — Telephone Encounter (Signed)
Medication refilled

## 2022-08-30 DIAGNOSIS — M9903 Segmental and somatic dysfunction of lumbar region: Secondary | ICD-10-CM | POA: Diagnosis not present

## 2022-08-30 DIAGNOSIS — M9902 Segmental and somatic dysfunction of thoracic region: Secondary | ICD-10-CM | POA: Diagnosis not present

## 2022-08-30 DIAGNOSIS — M9901 Segmental and somatic dysfunction of cervical region: Secondary | ICD-10-CM | POA: Diagnosis not present

## 2022-08-30 DIAGNOSIS — M9905 Segmental and somatic dysfunction of pelvic region: Secondary | ICD-10-CM | POA: Diagnosis not present

## 2022-09-02 DIAGNOSIS — M9905 Segmental and somatic dysfunction of pelvic region: Secondary | ICD-10-CM | POA: Diagnosis not present

## 2022-09-02 DIAGNOSIS — M9903 Segmental and somatic dysfunction of lumbar region: Secondary | ICD-10-CM | POA: Diagnosis not present

## 2022-09-02 DIAGNOSIS — M9902 Segmental and somatic dysfunction of thoracic region: Secondary | ICD-10-CM | POA: Diagnosis not present

## 2022-09-02 DIAGNOSIS — M9901 Segmental and somatic dysfunction of cervical region: Secondary | ICD-10-CM | POA: Diagnosis not present

## 2022-09-04 DIAGNOSIS — M9901 Segmental and somatic dysfunction of cervical region: Secondary | ICD-10-CM | POA: Diagnosis not present

## 2022-09-04 DIAGNOSIS — M9902 Segmental and somatic dysfunction of thoracic region: Secondary | ICD-10-CM | POA: Diagnosis not present

## 2022-09-04 DIAGNOSIS — M9905 Segmental and somatic dysfunction of pelvic region: Secondary | ICD-10-CM | POA: Diagnosis not present

## 2022-09-04 DIAGNOSIS — M9903 Segmental and somatic dysfunction of lumbar region: Secondary | ICD-10-CM | POA: Diagnosis not present

## 2022-09-20 ENCOUNTER — Other Ambulatory Visit: Payer: Self-pay | Admitting: Urology

## 2022-09-23 ENCOUNTER — Other Ambulatory Visit: Payer: Self-pay

## 2022-09-23 DIAGNOSIS — M9903 Segmental and somatic dysfunction of lumbar region: Secondary | ICD-10-CM | POA: Diagnosis not present

## 2022-09-23 DIAGNOSIS — N529 Male erectile dysfunction, unspecified: Secondary | ICD-10-CM

## 2022-09-23 DIAGNOSIS — M9902 Segmental and somatic dysfunction of thoracic region: Secondary | ICD-10-CM | POA: Diagnosis not present

## 2022-09-23 DIAGNOSIS — M9901 Segmental and somatic dysfunction of cervical region: Secondary | ICD-10-CM | POA: Diagnosis not present

## 2022-09-23 DIAGNOSIS — M9905 Segmental and somatic dysfunction of pelvic region: Secondary | ICD-10-CM | POA: Diagnosis not present

## 2022-09-23 MED ORDER — SILDENAFIL CITRATE 25 MG PO TABS
ORAL_TABLET | ORAL | 11 refills | Status: DC
Start: 2022-09-23 — End: 2023-09-08

## 2022-09-23 NOTE — Telephone Encounter (Signed)
Pt calls triage line and states that he is having difficulty obtaining RX for sildenafil with goodrx. Pt states that the pharmacy does is repeatedly using his insurance instead of goodrx. Advised pt that it is certainly his right to pay for medication out of pocket however we do not have control over what [facility] is doing. Resent RX in with comment to the pharmacy not to bill insurance and updated refill amount.

## 2022-09-26 DIAGNOSIS — M545 Low back pain, unspecified: Secondary | ICD-10-CM | POA: Diagnosis not present

## 2022-09-26 DIAGNOSIS — E538 Deficiency of other specified B group vitamins: Secondary | ICD-10-CM | POA: Diagnosis not present

## 2022-09-26 DIAGNOSIS — G608 Other hereditary and idiopathic neuropathies: Secondary | ICD-10-CM | POA: Diagnosis not present

## 2022-09-27 DIAGNOSIS — M9901 Segmental and somatic dysfunction of cervical region: Secondary | ICD-10-CM | POA: Diagnosis not present

## 2022-09-27 DIAGNOSIS — M9903 Segmental and somatic dysfunction of lumbar region: Secondary | ICD-10-CM | POA: Diagnosis not present

## 2022-09-27 DIAGNOSIS — M9902 Segmental and somatic dysfunction of thoracic region: Secondary | ICD-10-CM | POA: Diagnosis not present

## 2022-09-27 DIAGNOSIS — M9905 Segmental and somatic dysfunction of pelvic region: Secondary | ICD-10-CM | POA: Diagnosis not present

## 2022-10-02 ENCOUNTER — Other Ambulatory Visit: Payer: Self-pay | Admitting: Student

## 2022-10-02 DIAGNOSIS — M545 Low back pain, unspecified: Secondary | ICD-10-CM

## 2022-10-14 DIAGNOSIS — M9905 Segmental and somatic dysfunction of pelvic region: Secondary | ICD-10-CM | POA: Diagnosis not present

## 2022-10-14 DIAGNOSIS — M9901 Segmental and somatic dysfunction of cervical region: Secondary | ICD-10-CM | POA: Diagnosis not present

## 2022-10-14 DIAGNOSIS — M9902 Segmental and somatic dysfunction of thoracic region: Secondary | ICD-10-CM | POA: Diagnosis not present

## 2022-10-14 DIAGNOSIS — M9903 Segmental and somatic dysfunction of lumbar region: Secondary | ICD-10-CM | POA: Diagnosis not present

## 2022-10-18 DIAGNOSIS — M9905 Segmental and somatic dysfunction of pelvic region: Secondary | ICD-10-CM | POA: Diagnosis not present

## 2022-10-18 DIAGNOSIS — M9902 Segmental and somatic dysfunction of thoracic region: Secondary | ICD-10-CM | POA: Diagnosis not present

## 2022-10-18 DIAGNOSIS — M9901 Segmental and somatic dysfunction of cervical region: Secondary | ICD-10-CM | POA: Diagnosis not present

## 2022-10-18 DIAGNOSIS — M9903 Segmental and somatic dysfunction of lumbar region: Secondary | ICD-10-CM | POA: Diagnosis not present

## 2022-10-22 DIAGNOSIS — M9901 Segmental and somatic dysfunction of cervical region: Secondary | ICD-10-CM | POA: Diagnosis not present

## 2022-10-22 DIAGNOSIS — M9905 Segmental and somatic dysfunction of pelvic region: Secondary | ICD-10-CM | POA: Diagnosis not present

## 2022-10-22 DIAGNOSIS — M9903 Segmental and somatic dysfunction of lumbar region: Secondary | ICD-10-CM | POA: Diagnosis not present

## 2022-10-22 DIAGNOSIS — M9902 Segmental and somatic dysfunction of thoracic region: Secondary | ICD-10-CM | POA: Diagnosis not present

## 2022-10-23 ENCOUNTER — Ambulatory Visit
Admission: RE | Admit: 2022-10-23 | Discharge: 2022-10-23 | Disposition: A | Discharge status: Home / Self Care | Payer: 59 | Source: Ambulatory Visit | Attending: Student | Admitting: Student

## 2022-10-23 DIAGNOSIS — M545 Low back pain, unspecified: Secondary | ICD-10-CM | POA: Diagnosis not present

## 2022-10-23 DIAGNOSIS — M5137 Other intervertebral disc degeneration, lumbosacral region: Secondary | ICD-10-CM | POA: Diagnosis not present

## 2022-10-23 DIAGNOSIS — M47816 Spondylosis without myelopathy or radiculopathy, lumbar region: Secondary | ICD-10-CM | POA: Diagnosis not present

## 2022-10-23 DIAGNOSIS — M5136 Other intervertebral disc degeneration, lumbar region: Secondary | ICD-10-CM | POA: Diagnosis not present

## 2022-10-25 DIAGNOSIS — M9901 Segmental and somatic dysfunction of cervical region: Secondary | ICD-10-CM | POA: Diagnosis not present

## 2022-10-25 DIAGNOSIS — M9905 Segmental and somatic dysfunction of pelvic region: Secondary | ICD-10-CM | POA: Diagnosis not present

## 2022-10-25 DIAGNOSIS — M9902 Segmental and somatic dysfunction of thoracic region: Secondary | ICD-10-CM | POA: Diagnosis not present

## 2022-10-25 DIAGNOSIS — M9903 Segmental and somatic dysfunction of lumbar region: Secondary | ICD-10-CM | POA: Diagnosis not present

## 2022-10-28 DIAGNOSIS — M9902 Segmental and somatic dysfunction of thoracic region: Secondary | ICD-10-CM | POA: Diagnosis not present

## 2022-10-28 DIAGNOSIS — M9901 Segmental and somatic dysfunction of cervical region: Secondary | ICD-10-CM | POA: Diagnosis not present

## 2022-10-28 DIAGNOSIS — M9905 Segmental and somatic dysfunction of pelvic region: Secondary | ICD-10-CM | POA: Diagnosis not present

## 2022-10-28 DIAGNOSIS — M9903 Segmental and somatic dysfunction of lumbar region: Secondary | ICD-10-CM | POA: Diagnosis not present

## 2022-11-01 DIAGNOSIS — M9902 Segmental and somatic dysfunction of thoracic region: Secondary | ICD-10-CM | POA: Diagnosis not present

## 2022-11-01 DIAGNOSIS — M9903 Segmental and somatic dysfunction of lumbar region: Secondary | ICD-10-CM | POA: Diagnosis not present

## 2022-11-01 DIAGNOSIS — M9905 Segmental and somatic dysfunction of pelvic region: Secondary | ICD-10-CM | POA: Diagnosis not present

## 2022-11-01 DIAGNOSIS — M9901 Segmental and somatic dysfunction of cervical region: Secondary | ICD-10-CM | POA: Diagnosis not present

## 2022-11-08 DIAGNOSIS — M9902 Segmental and somatic dysfunction of thoracic region: Secondary | ICD-10-CM | POA: Diagnosis not present

## 2022-11-08 DIAGNOSIS — M9903 Segmental and somatic dysfunction of lumbar region: Secondary | ICD-10-CM | POA: Diagnosis not present

## 2022-11-08 DIAGNOSIS — M9905 Segmental and somatic dysfunction of pelvic region: Secondary | ICD-10-CM | POA: Diagnosis not present

## 2022-11-08 DIAGNOSIS — M9901 Segmental and somatic dysfunction of cervical region: Secondary | ICD-10-CM | POA: Diagnosis not present

## 2022-11-22 DIAGNOSIS — M9901 Segmental and somatic dysfunction of cervical region: Secondary | ICD-10-CM | POA: Diagnosis not present

## 2022-11-22 DIAGNOSIS — M9903 Segmental and somatic dysfunction of lumbar region: Secondary | ICD-10-CM | POA: Diagnosis not present

## 2022-11-22 DIAGNOSIS — M9902 Segmental and somatic dysfunction of thoracic region: Secondary | ICD-10-CM | POA: Diagnosis not present

## 2022-11-22 DIAGNOSIS — M9905 Segmental and somatic dysfunction of pelvic region: Secondary | ICD-10-CM | POA: Diagnosis not present

## 2022-12-02 DIAGNOSIS — M9901 Segmental and somatic dysfunction of cervical region: Secondary | ICD-10-CM | POA: Diagnosis not present

## 2022-12-02 DIAGNOSIS — M9905 Segmental and somatic dysfunction of pelvic region: Secondary | ICD-10-CM | POA: Diagnosis not present

## 2022-12-02 DIAGNOSIS — M9903 Segmental and somatic dysfunction of lumbar region: Secondary | ICD-10-CM | POA: Diagnosis not present

## 2022-12-02 DIAGNOSIS — M9902 Segmental and somatic dysfunction of thoracic region: Secondary | ICD-10-CM | POA: Diagnosis not present

## 2022-12-06 DIAGNOSIS — M9901 Segmental and somatic dysfunction of cervical region: Secondary | ICD-10-CM | POA: Diagnosis not present

## 2022-12-06 DIAGNOSIS — M9905 Segmental and somatic dysfunction of pelvic region: Secondary | ICD-10-CM | POA: Diagnosis not present

## 2022-12-06 DIAGNOSIS — M9902 Segmental and somatic dysfunction of thoracic region: Secondary | ICD-10-CM | POA: Diagnosis not present

## 2022-12-06 DIAGNOSIS — M9903 Segmental and somatic dysfunction of lumbar region: Secondary | ICD-10-CM | POA: Diagnosis not present

## 2022-12-13 DIAGNOSIS — M9905 Segmental and somatic dysfunction of pelvic region: Secondary | ICD-10-CM | POA: Diagnosis not present

## 2022-12-13 DIAGNOSIS — M9903 Segmental and somatic dysfunction of lumbar region: Secondary | ICD-10-CM | POA: Diagnosis not present

## 2022-12-13 DIAGNOSIS — M9901 Segmental and somatic dysfunction of cervical region: Secondary | ICD-10-CM | POA: Diagnosis not present

## 2022-12-13 DIAGNOSIS — M9902 Segmental and somatic dysfunction of thoracic region: Secondary | ICD-10-CM | POA: Diagnosis not present

## 2022-12-23 ENCOUNTER — Ambulatory Visit: Payer: Self-pay | Admitting: Physician Assistant

## 2022-12-23 ENCOUNTER — Encounter: Payer: Self-pay | Admitting: Physician Assistant

## 2022-12-23 VITALS — BP 147/98 | HR 68 | Temp 97.6°F | Resp 12 | Ht 70.0 in | Wt 225.0 lb

## 2022-12-23 DIAGNOSIS — J069 Acute upper respiratory infection, unspecified: Secondary | ICD-10-CM

## 2022-12-23 MED ORDER — PSEUDOEPH-BROMPHEN-DM 30-2-10 MG/5ML PO SYRP: 5.0000 mL | ORAL_SOLUTION | Freq: Four times a day (QID) | ORAL | 0 refills | Status: DC | PRN

## 2022-12-23 MED ORDER — METHYLPREDNISOLONE 4 MG PO TBPK: ORAL_TABLET | ORAL | 0 refills | Status: DC

## 2022-12-23 NOTE — Progress Notes (Signed)
  Subjective:     Patient ID: Marcus Riley, male    DOB: 1960/06/11, 62 y.o.   MRN: [id number]  Chief Complaint  Patient presents with   Cough    Cough Pertinent negatives include no chills, fever, shortness of breath or wheezing.   Patient presents for evaluation of nasal congestion and cough x 5 days. Reports increased green sputum production expelled by cough and nasal drainage. Patient has history of nasal polyps and is adherent with regimen of daily Flonase, and BID nasal rinses. Reports using Robitussin DM for cough in the evenings with partial relief. Denies fever, chills, headaches, sore throat or shortness of breath. Denies sick contacts.   Review of Systems  Constitutional:  Negative for chills, fever and malaise/fatigue.  HENT: Negative.    Respiratory:  Positive for cough and sputum production. Negative for shortness of breath and wheezing.   Cardiovascular: Negative.   Musculoskeletal: Negative.   Neurological: Negative.   Psychiatric/Behavioral: Negative.          Objective:    BP (!) 147/98   Pulse 68   Temp 97.6 F (36.4 C) (Temporal)   Resp 12   Ht 5\' 10"  (1.778 m)   Wt 225 lb (102.1 kg)   SpO2 97%   BMI 32.28 kg/m    Physical Exam Constitutional:      Appearance: He is not ill-appearing.  HENT:     Mouth/Throat:     Mouth: Mucous membranes are moist.     Pharynx: Oropharynx is clear.  Cardiovascular:     Rate and Rhythm: Normal rate and regular rhythm.  Pulmonary:     Effort: Pulmonary effort is normal.     Breath sounds: Normal breath sounds.  Skin:    General: Skin is warm and dry.     Capillary Refill: Capillary refill takes less than 2 seconds.  Neurological:     General: No focal deficit present.     Mental Status: He is alert. Mental status is at baseline.  Psychiatric:        Mood and Affect: Mood normal.        Behavior: Behavior normal.       Assessment & Plan:   Problem List Items Addressed This Visit   None   Meds ordered  this encounter  Medications   brompheniramine-pseudoephedrine-DM 30-2-10 MG/5ML syrup    Sig: Take 5 mLs by mouth 4 (four) times daily as needed.    Dispense:  120 mL    Refill:  0   methylPREDNISolone (MEDROL DOSEPAK) 4 MG TBPK tablet    Sig: Take Tapered dose as directed    Dispense:  21 tablet    Refill:  0   Prescribed Bromfed DM Medrol Dosepak. Educated on proper use of medication and need to taper Medrol as ordered. Encouraged to contact office with any concerns or needs.   Delma Post, RN

## 2022-12-23 NOTE — Progress Notes (Signed)
Pt presents today with cough since Wednesday x 5 days. For the last two nights pt has taken one dose of robitussin once per night. Pt states he's also taken zyrtec wed through Friday. 2 years ago ENT directed him to nasal rinse twice  day. Pt states that he faithfully does a nasal rinse twice day for two years along with nasal pray after each rinse.  Pt has also been experiencing some head congestion while coughing up mucus.

## 2022-12-30 ENCOUNTER — Ambulatory Visit
Admission: RE | Admit: 2022-12-30 | Discharge: 2022-12-30 | Disposition: A | Discharge status: Home / Self Care | Payer: 59 | Source: Ambulatory Visit | Attending: Physician Assistant | Admitting: Physician Assistant

## 2022-12-30 ENCOUNTER — Ambulatory Visit: Payer: Self-pay | Admitting: Physician Assistant

## 2022-12-30 ENCOUNTER — Encounter: Payer: Self-pay | Admitting: Physician Assistant

## 2022-12-30 ENCOUNTER — Ambulatory Visit
Admission: RE | Admit: 2022-12-30 | Discharge: 2022-12-30 | Disposition: A | Discharge status: Home / Self Care | Payer: 59 | Attending: Physician Assistant | Admitting: Physician Assistant

## 2022-12-30 VITALS — BP 146/84 | HR 77 | Temp 98.2°F | Resp 14 | Ht 70.0 in | Wt 225.0 lb

## 2022-12-30 DIAGNOSIS — R059 Cough, unspecified: Secondary | ICD-10-CM

## 2022-12-30 DIAGNOSIS — R058 Other specified cough: Secondary | ICD-10-CM | POA: Diagnosis not present

## 2022-12-30 NOTE — Progress Notes (Signed)
   Subjective: Productive cough    Patient ID: Marcus Riley, male    DOB: 05-Jun-1960, 62 y.o.   MRN: [id number]  HPI Patient follow-up secondary to fever and productive cough.  Patient was seen 1 week ago and given a prescription for Bromfed-DM and Medrol Dosepak.  Patient states no change in complaint.  States cough is productive and greenish in color.  States cough increases at night.  Requesting antibiotics.   Review of Systems Negative septa above complaint    Objective:   Physical Exam BP 146/84  Pulse 77  Resp 14  Temp 98.2 F (36.8 C)  Temp src Temporal  SpO2 97 %  Weight 225 lb (102.1 kg)  Height 5\' 10"  (1.778 m)   BMI 32.28 kg/m2  BSA 2.25 m2  No acute distress.  Afebrile. HEENT is unremarkable.   Neck is supple. Lungs are clear to auscultation. Heart regular rate and rhythm.       Assessment & Plan: Respiratory infection  Advised patient I will obtain a chest x-ray before starting antibiotics if his exam is normal.  X-ray will be sent for stat and patient will be notified of findings either at the close of business today or first thing in the morning.

## 2022-12-30 NOTE — Progress Notes (Signed)
Pt presents today for follow up, pt states that he doesn't feel better at all. Pt is now coughing up green thick mucus. Pt states chest still feels very congested.

## 2022-12-31 ENCOUNTER — Other Ambulatory Visit: Payer: Self-pay | Admitting: Physician Assistant

## 2022-12-31 DIAGNOSIS — M9901 Segmental and somatic dysfunction of cervical region: Secondary | ICD-10-CM | POA: Diagnosis not present

## 2022-12-31 DIAGNOSIS — M9902 Segmental and somatic dysfunction of thoracic region: Secondary | ICD-10-CM | POA: Diagnosis not present

## 2022-12-31 DIAGNOSIS — M9905 Segmental and somatic dysfunction of pelvic region: Secondary | ICD-10-CM | POA: Diagnosis not present

## 2022-12-31 DIAGNOSIS — M9903 Segmental and somatic dysfunction of lumbar region: Secondary | ICD-10-CM | POA: Diagnosis not present

## 2022-12-31 MED ORDER — HYDROCOD POLI-CHLORPHE POLI ER 10-8 MG/5ML PO SUER: 5.0000 mL | Freq: Two times a day (BID) | ORAL | 0 refills | Status: DC | PRN

## 2023-01-09 DIAGNOSIS — M545 Low back pain, unspecified: Secondary | ICD-10-CM | POA: Diagnosis not present

## 2023-01-09 DIAGNOSIS — M65332 Trigger finger, left middle finger: Secondary | ICD-10-CM | POA: Diagnosis not present

## 2023-01-09 DIAGNOSIS — G608 Other hereditary and idiopathic neuropathies: Secondary | ICD-10-CM | POA: Diagnosis not present

## 2023-01-09 DIAGNOSIS — R202 Paresthesia of skin: Secondary | ICD-10-CM | POA: Diagnosis not present

## 2023-01-10 DIAGNOSIS — M9901 Segmental and somatic dysfunction of cervical region: Secondary | ICD-10-CM | POA: Diagnosis not present

## 2023-01-10 DIAGNOSIS — M9903 Segmental and somatic dysfunction of lumbar region: Secondary | ICD-10-CM | POA: Diagnosis not present

## 2023-01-10 DIAGNOSIS — M9902 Segmental and somatic dysfunction of thoracic region: Secondary | ICD-10-CM | POA: Diagnosis not present

## 2023-01-10 DIAGNOSIS — M9905 Segmental and somatic dysfunction of pelvic region: Secondary | ICD-10-CM | POA: Diagnosis not present

## 2023-01-22 ENCOUNTER — Ambulatory Visit: Payer: 59 | Admitting: Occupational Therapy

## 2023-01-27 ENCOUNTER — Ambulatory Visit: Payer: 59 | Admitting: Occupational Therapy

## 2023-01-31 DIAGNOSIS — M9901 Segmental and somatic dysfunction of cervical region: Secondary | ICD-10-CM | POA: Diagnosis not present

## 2023-01-31 DIAGNOSIS — M9905 Segmental and somatic dysfunction of pelvic region: Secondary | ICD-10-CM | POA: Diagnosis not present

## 2023-01-31 DIAGNOSIS — M9903 Segmental and somatic dysfunction of lumbar region: Secondary | ICD-10-CM | POA: Diagnosis not present

## 2023-01-31 DIAGNOSIS — M9902 Segmental and somatic dysfunction of thoracic region: Secondary | ICD-10-CM | POA: Diagnosis not present

## 2023-02-03 ENCOUNTER — Encounter: Payer: 59 | Admitting: Occupational Therapy

## 2023-02-04 ENCOUNTER — Encounter: Payer: Self-pay | Admitting: *Deleted

## 2023-02-04 ENCOUNTER — Emergency Department
Admission: EM | Admit: 2023-02-04 | Discharge: 2023-02-04 | Disposition: A | Discharge status: Home / Self Care | Payer: No Typology Code available for payment source | Attending: Emergency Medicine | Admitting: Emergency Medicine

## 2023-02-04 ENCOUNTER — Other Ambulatory Visit: Payer: Self-pay

## 2023-02-04 DIAGNOSIS — Z23 Encounter for immunization: Secondary | ICD-10-CM | POA: Insufficient documentation

## 2023-02-04 DIAGNOSIS — S61451A Open bite of right hand, initial encounter: Secondary | ICD-10-CM | POA: Diagnosis present

## 2023-02-04 DIAGNOSIS — W540XXA Bitten by dog, initial encounter: Secondary | ICD-10-CM | POA: Insufficient documentation

## 2023-02-04 DIAGNOSIS — S61210A Laceration without foreign body of right index finger without damage to nail, initial encounter: Secondary | ICD-10-CM | POA: Diagnosis not present

## 2023-02-04 MED ORDER — AMOXICILLIN-POT CLAVULANATE 875-125 MG PO TABS: 1.0000 | ORAL_TABLET | Freq: Two times a day (BID) | ORAL | 0 refills | Status: AC

## 2023-02-04 MED ORDER — AMOXICILLIN-POT CLAVULANATE 875-125 MG PO TABS
1.0000 | ORAL_TABLET | Freq: Once | ORAL | Status: AC
  Administered 2023-02-04: 1 via ORAL
  Filled 2023-02-04: qty 1

## 2023-02-04 MED ORDER — TETANUS-DIPHTH-ACELL PERTUSSIS 5-2.5-18.5 LF-MCG/0.5 IM SUSY
0.5000 mL | PREFILLED_SYRINGE | Freq: Once | INTRAMUSCULAR | Status: AC
  Administered 2023-02-04: 0.5 mL via INTRAMUSCULAR
  Filled 2023-02-04: qty 0.5

## 2023-02-04 MED ORDER — LIDOCAINE HCL (PF) 1 % IJ SOLN
5.0000 mL | Freq: Once | INTRAMUSCULAR | Status: AC
  Administered 2023-02-04: 5 mL
  Filled 2023-02-04: qty 5

## 2023-02-04 NOTE — ED Notes (Signed)
NT at bedside to complete workers comp. MD Ray at bedside to suture patient's lac.

## 2023-02-04 NOTE — Discharge Instructions (Signed)
You were seen in the emergency room today for evaluation after dog bite.  Your tetanus was updated.  We loosely closed your laceration.  I sent a prescription for antibiotics to try and prevent an infection from developing in this area.  Please follow-up with your primary care doctor in about a week for reevaluation of your wound.  Please have your sutures removed in 10 to 14 days.  You can take Tylenol and ibuprofen as needed to help with your pain.  Return to the ER for new or worsening symptoms.

## 2023-02-04 NOTE — ED Provider Notes (Signed)
[facility] Provider Note    Event Date/Time   First MD Initiated Contact with Patient 02/04/23 1818     (approximate)   History   Animal Bite   HPI  Marcus Riley is a 62 year old male presenting to the ER for evaluation of dog bite.  Patient works with [employer].  He walked by an area with a pit bull in bite quarantine when his finger accidentally got in the area of the dog and he was bit over his right second digit.  No injuries to other areas.  Unsure last tetanus shot.  He reports the dog remains in quarantine and can be observed to see if it develops any symptoms suggestive of rabies.  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     Physical Exam   Triage Vital Signs: ED Triage Vitals  Encounter Vitals Group     BP 02/04/23 1554 (!) 174/106     Systolic BP Percentile --      Diastolic BP Percentile --      Pulse Rate 02/04/23 1554 87     Resp 02/04/23 1554 20     Temp 02/04/23 1554 98 F (36.7 C)     Temp Source 02/04/23 1554 Oral     SpO2 02/04/23 1554 99 %     Weight 02/04/23 1555 225 lb (102.1 kg)     Height 02/04/23 1555 5\' 10"  (1.778 m)     Head Circumference --      Peak Flow --      Pain Score 02/04/23 1555 3     Pain Loc --      Pain Education --      Exclude from Growth Chart --     Most recent vital signs: Vitals:   02/04/23 1922 02/04/23 2017  BP: (!) 155/95 (!) 146/101  Pulse: 71 63  Resp: 18 16  Temp: 98.3 F (36.8 C)   SpO2: (!) 71% 100%     General: Awake, interactive  CV:  Regular rate, good peripheral perfusion.  Resp:  Unlabored respirations.  Abd:  Nondistended.  Neuro:  Symmetric facial movement, fluid speech MSK:  There is a 1 cm laceration of the second digit distal to the DIP over the volar aspect.  There is some bruising controlled with direct pressure.  Patient has full range of motion of the digit with intact sensation over the finger.   ED Results / Procedures / Treatments    Labs (all labs ordered are listed, but only abnormal results are displayed) Labs Reviewed - No data to display   EKG EKG independently reviewed interpreted by myself (ER attending) demonstrates:    RADIOLOGY Imaging independently reviewed and interpreted by myself demonstrates:    PROCEDURES:  Critical Care performed: No  ..Laceration Repair  Date/Time: 02/04/2023 8:16 PM  Performed by: Trinna Post, MD Authorized by: Trinna Post, MD   Consent:    Consent obtained:  Verbal   Risks, benefits, and alternatives were discussed: yes   Anesthesia:    Anesthesia method:  Local infiltration and nerve block   Local anesthetic:  Lidocaine 1% w/o epi   Block anesthetic:  Lidocaine 1% w/o epi   Block technique:  Digital block Laceration details:    Location:  Finger   Finger location:  R index finger   Length (cm):  1 Treatment:    Irrigation solution:  Tap water   Irrigation volume:  10 minutes in sink  Irrigation method:  Tap Skin repair:    Repair method:  Sutures   Suture size:  3-0   Suture material:  Prolene   Suture technique:  Simple interrupted Approximation:    Approximation:  Loose Repair type:    Repair type:  Simple    MEDICATIONS ORDERED IN ED: Medications  Tdap (BOOSTRIX) injection 0.5 mL (0.5 mLs Intramuscular Given 02/04/23 1937)  lidocaine (PF) (XYLOCAINE) 1 % injection 5 mL (5 mLs Infiltration Given by Other 02/04/23 1954)  amoxicillin-clavulanate (AUGMENTIN) 875-125 MG per tablet 1 tablet (1 tablet Oral Given 02/04/23 2016)     IMPRESSION / MDM / ASSESSMENT AND PLAN / ED COURSE  I reviewed the triage vital signs and the nursing notes.  Differential diagnosis includes, but is not limited to, dog bite with laceration, no evidence of neurovascular compromise  Patient's presentation is most consistent with acute, uncomplicated illness.  62 year old male presenting to the emergency department following a dog bite.  Isolated laceration over the  index finger.  Tetanus updated.  Moderate gaping associated with laceration.  Did discuss increased risk of infection in this area and with a dog bite.  After shared decision-making, loose closure of the laceration was performed as above.  Patient instructed to present for rabies vaccination should animal develop any signs concerning for this.  Strict return precautions were provided.  Patient was discharged stable condition.      FINAL CLINICAL IMPRESSION(S) / ED DIAGNOSES   Final diagnoses:  Dog bite of right hand, initial encounter     Rx / DC Orders   ED Discharge Orders          Ordered    amoxicillin-clavulanate (AUGMENTIN) 875-125 MG tablet  2 times daily        02/04/23 2019             Note:  This document was prepared using Dragon voice recognition software and may include unintentional dictation errors.   Trinna Post, MD 02/04/23 2021

## 2023-02-04 NOTE — ED Triage Notes (Signed)
Pt ambulatory to triage. Pt has dog bite to right index finger.  Pt works at [employer].  Pit bull bit finger.   Bleeding controlled.   Pt states WC   pt alert  speech clear.

## 2023-02-05 ENCOUNTER — Ambulatory Visit: Payer: 59 | Attending: Physician Assistant | Admitting: Occupational Therapy

## 2023-02-05 DIAGNOSIS — R278 Other lack of coordination: Secondary | ICD-10-CM | POA: Diagnosis not present

## 2023-02-05 DIAGNOSIS — M6281 Muscle weakness (generalized): Secondary | ICD-10-CM | POA: Insufficient documentation

## 2023-02-05 NOTE — Therapy (Signed)
OUTPATIENT OCCUPATIONAL THERAPY NEURO EVALUATION  Patient Name: Marcus Riley MRN: [id number] DOB:August 28, 1960, 62 y.o., male Today's Date: 02/05/2023  PCP: *** REFERRING PROVIDER: ***  END OF SESSION:   Past Medical History:  Diagnosis Date   Hypertension    Past Surgical History:  Procedure Laterality Date   COLONOSCOPY N/A 10/13/2013   Note from [facility] says to repeat in 5 years   COLONOSCOPY  02/2021   Dr. Norma Fredrickson of [facility]   Patient Active Problem List   Diagnosis Date Noted   Mixed sensory-motor polyneuropathy 06/16/2017    ONSET DATE: ***  REFERRING DIAG: ***  THERAPY DIAG:  No diagnosis found.  Rationale for Evaluation and Treatment: {HABREHAB:27488}  SUBJECTIVE:   SUBJECTIVE STATEMENT: *** Pt accompanied by: {accompnied:27141}  PERTINENT HISTORY: ***  PRECAUTIONS: {Therapy precautions:24002}  WEIGHT BEARING RESTRICTIONS: No  PAIN:  Are you having pain? {OPRCPAIN:27236}  FALLS: Has patient fallen in last 6 months? {fallsyesno:27318}  LIVING ENVIRONMENT: Lives with: lives with their spouse Lives in: House/apartment Stairs: 8 steps to enter Has following equipment at home: {Assistive devices:23999}  PLOF: {PLOF:24004}  PATIENT GOALS: ***  OBJECTIVE:  Note: Objective measures were completed at Evaluation unless otherwise noted.  HAND DOMINANCE: {MISC; OT HAND [phone]}  ADLs: Overall ADLs: *** Transfers/ambulation related to ADLs: Eating: Independent Grooming: Independent UB Dressing: *** LB Dressing: *** Toileting: *** Bathing: *** Tub Shower transfers: *** Equipment: {equipment:25573}  IADLs: Shopping: *** Light housekeeping: *** Meal Prep: *** Community mobility: *** Medication management: *** Financial management: *** Handwriting: {OTWRITTENEXPRESSION:25361}  MOBILITY STATUS: {OTMOBILITY:25360}  POSTURE COMMENTS:  {posture:25561} Sitting balance: {sitting balance:25483}  ACTIVITY TOLERANCE: Activity  tolerance: ***  FUNCTIONAL OUTCOME MEASURES: {OTFUNCTIONALMEASURES:27238}  UPPER EXTREMITY ROM:    {AROM/PROM:27142} ROM Right eval Left eval  Shoulder flexion    Shoulder abduction    Shoulder adduction    Shoulder extension    Shoulder internal rotation    Shoulder external rotation    Elbow flexion    Elbow extension    Wrist flexion    Wrist extension    Wrist ulnar deviation    Wrist radial deviation    Wrist pronation    Wrist supination    (Blank rows = not tested)  UPPER EXTREMITY MMT:     MMT Right eval Left eval  Shoulder flexion    Shoulder abduction    Shoulder adduction    Shoulder extension    Shoulder internal rotation    Shoulder external rotation    Middle trapezius    Lower trapezius    Elbow flexion    Elbow extension    Wrist flexion    Wrist extension    Wrist ulnar deviation    Wrist radial deviation    Wrist pronation    Wrist supination    (Blank rows = not tested)  HAND FUNCTION: {handfunction:27230}  COORDINATION: {otcoordination:27237}  SENSATION: {sensation:27233}  EDEMA: ***  MUSCLE TONE: {UETONE:25567}  COGNITION: Overall cognitive status: {cognition:24006}  VISION: Subjective report: *** Baseline vision: {OTBASELINEVISION:25363} Visual history: {OTVISUALHISTORY:25364}  VISION ASSESSMENT: {visionassessment:27231}  Patient has difficulty with following activities due to following visual impairments: ***  PERCEPTION: {Perception:25564}  PRAXIS: {Praxis:25565}  OBSERVATIONS: ***   TODAY'S TREATMENT:  DATE: ***   PATIENT EDUCATION: Education details: *** Person educated: {Person educated:25204} Education method: {Education Method:25205} Education comprehension: {Education Comprehension:25206}  HOME EXERCISE PROGRAM: ***   GOALS: Goals reviewed with patient?  {yes/no:20286}  SHORT TERM GOALS: Target date: ***  *** Baseline: Goal status: INITIAL  2.  *** Baseline:  Goal status: INITIAL  3.  *** Baseline:  Goal status: INITIAL  4.  *** Baseline:  Goal status: INITIAL  5.  *** Baseline:  Goal status: INITIAL  6.  *** Baseline:  Goal status: INITIAL  LONG TERM GOALS: Target date: ***  *** Baseline:  Goal status: INITIAL  2.  *** Baseline:  Goal status: INITIAL  3.  *** Baseline:  Goal status: INITIAL  4.  *** Baseline:  Goal status: INITIAL  5.  *** Baseline:  Goal status: INITIAL  6.  *** Baseline:  Goal status: INITIAL  ASSESSMENT:  CLINICAL IMPRESSION: Patient is a *** y.o. *** who was seen today for occupational therapy evaluation for ***.   PERFORMANCE DEFICITS: in functional skills including {OT physical skills:25468}, cognitive skills including {OT cognitive skills:25469}, and psychosocial skills including {OT psychosocial skills:25470}.   IMPAIRMENTS: are limiting patient from {OT performance deficits:25471}.   CO-MORBIDITIES: {Comorbidities:25485} that affects occupational performance. Patient will benefit from skilled OT to address above impairments and improve overall function.  MODIFICATION OR ASSISTANCE TO COMPLETE EVALUATION: {OT modification:25474}  OT OCCUPATIONAL PROFILE AND HISTORY: {OT PROFILE AND HISTORY:25484}  CLINICAL DECISION MAKING: {OT CDM:25475}  REHAB POTENTIAL: {rehabpotential:25112}  EVALUATION COMPLEXITY: {Evaluation complexity:25115}    PLAN:  OT FREQUENCY: {rehab frequency:25116}  OT DURATION: {rehab duration:25117}  PLANNED INTERVENTIONS: {OT Interventions:25467}  RECOMMENDED OTHER SERVICES: ***  CONSULTED AND AGREED WITH PLAN OF CARE: {TKZ:60109}  PLAN FOR NEXT SESSION: Olegario Messier, OT 02/05/2023, 4:33 PM

## 2023-02-10 ENCOUNTER — Ambulatory Visit: Payer: 59 | Admitting: Occupational Therapy

## 2023-02-11 ENCOUNTER — Encounter: Payer: Self-pay | Admitting: Physician Assistant

## 2023-02-11 ENCOUNTER — Ambulatory Visit: Payer: Self-pay | Admitting: Physician Assistant

## 2023-02-11 VITALS — BP 144/84 | HR 75 | Temp 98.2°F | Resp 16

## 2023-02-11 DIAGNOSIS — Z4802 Encounter for removal of sutures: Secondary | ICD-10-CM

## 2023-02-11 NOTE — Progress Notes (Signed)
   Subjective: Suture removal    Patient ID: Marcus Riley, male    DOB: 08/24/1960, 62 y.o.   MRN: [id number]  HPI Patient follow-up from dog bite/incision to the right index finger which occurred on 04 February 2023.  Patient was seen at the emergency room and wound was loosely closed with 3 interrupted sutures.  Patient presents today for suture removal.  Patient continue to take antibiotics as prescribed.   Review of Systems Negative except for above complaint    Objective:   Physical Exam BP 144/84  Pulse 75  Resp 16  Temp 98.2 F (36.8 C)  SpO2 99 %  No acute distress.  Mild edema and erythema to the distal second digit right hand.  Free and equal range of motion.  Neurovascular intact.  Wound has not completely healed.       Assessment & Plan: Suture removal  3 sutures were removed.  Area was reclean and Steri-Strips applied.  Advised patient wounds are now healed by granulation.  Advised to follow-up in 112 04/2022 for reevaluation.  Advised to keep wound clean and dry until next exam.

## 2023-02-11 NOTE — Progress Notes (Signed)
Follow up workers comp injury 02/04/23 with dog bite tip of right index finger seen at ED with 3 loose stitches and PO ABT which he states compliance.  Note dark nail bed with swelling and inflammation at wound site with small open area with small amount serous drainage and arrived with dressing intact.  Pain level at this time is level 1.  He is off on vacation this week and not working at [employer].

## 2023-02-12 ENCOUNTER — Ambulatory Visit: Payer: 59 | Admitting: Occupational Therapy

## 2023-02-17 ENCOUNTER — Encounter: Payer: 59 | Admitting: Occupational Therapy

## 2023-02-17 ENCOUNTER — Ambulatory Visit: Payer: Self-pay | Admitting: Physician Assistant

## 2023-02-17 ENCOUNTER — Encounter: Payer: Self-pay | Admitting: Physician Assistant

## 2023-02-17 VITALS — BP 145/89 | HR 82 | Temp 98.0°F | Resp 16

## 2023-02-17 DIAGNOSIS — T148XXD Other injury of unspecified body region, subsequent encounter: Secondary | ICD-10-CM

## 2023-02-17 NOTE — Progress Notes (Signed)
   Subjective: Wound check    Patient ID: Marcus Riley, male    DOB: January 29, 1961, 62 y.o.   MRN: [id number]  HPI Patient presents today for wound check secondary to a dog bite to the second digit right hand which occurred on 02/04/2023.  Patient is kept the wound clean  dry, and bandaged since his last visit on 02/11/2023.  Denies complaint.    Review of Systems Negative except for above complaint   Objective:   Physical Exam BP 145/89  Pulse 82  Resp 16  Temp 98 F (36.7 C)  SpO2 97 %  Patient is right-hand dominant.  Examination of wound shows no edema or erythema.  Wound is healing by granulation.  Steri-Strips are still in place.  Neurovascular intact with free and equal range of motion.       Assessment & Plan: Wound check  Patient's finger was soaked in warm Betadine and water solution.  Steri-Strips removed.  Bacitracin was applied to the wound and cover with Band-Aid.  Patient advised to keep wound clean and dry.  Keep bandaged while at work.  Remove bandage at home.  Rebandaged before going to sleep.  Follow-up 1 week if no improvement or worsening complaint.

## 2023-02-17 NOTE — Progress Notes (Signed)
Follow up Workers comp injury dog bite to tip of right index finger.  He kept dressing dry and intact with steri strips and changed it on 11/28 and 12/1 and tolerated well.  Dressing removed today and no drainage on bandage and steri strips intact.  Wound cleansed with diluted betadine and warm water for steri strips.

## 2023-02-24 ENCOUNTER — Ambulatory Visit: Payer: Self-pay

## 2023-02-24 NOTE — Progress Notes (Signed)
Requested nurse and provider look at his healing right index finger post dog bite on 02/14/23.  No drainage noted with continued extensive healing bruising and dark fingernail.  Will continue to cover while working or handling his cat at home and use Aquaphor to ear while healing and protect with bandaid for newly healed skin as needed.  No evidence of infection and denies pain or pressure to that nail bed on right index finger wound.  Will call if needed for further evaluation.

## 2023-02-25 ENCOUNTER — Encounter: Payer: 59 | Admitting: Occupational Therapy

## 2023-02-25 DIAGNOSIS — M9902 Segmental and somatic dysfunction of thoracic region: Secondary | ICD-10-CM | POA: Diagnosis not present

## 2023-02-25 DIAGNOSIS — M9905 Segmental and somatic dysfunction of pelvic region: Secondary | ICD-10-CM | POA: Diagnosis not present

## 2023-02-25 DIAGNOSIS — M9903 Segmental and somatic dysfunction of lumbar region: Secondary | ICD-10-CM | POA: Diagnosis not present

## 2023-02-25 DIAGNOSIS — M9901 Segmental and somatic dysfunction of cervical region: Secondary | ICD-10-CM | POA: Diagnosis not present

## 2023-02-26 ENCOUNTER — Encounter: Payer: 59 | Admitting: Occupational Therapy

## 2023-02-27 ENCOUNTER — Ambulatory Visit: Payer: 59 | Attending: Physician Assistant | Admitting: Occupational Therapy

## 2023-02-27 DIAGNOSIS — M6281 Muscle weakness (generalized): Secondary | ICD-10-CM | POA: Diagnosis not present

## 2023-02-27 NOTE — Therapy (Addendum)
OUTPATIENT OCCUPATIONAL THERAPY TREATMENT  Patient Name: Marcus Riley MRN: [id number] DOB:10/20/1960, 62 y.o., male Today's Date: 02/27/2023   REFERRING PROVIDERS: Nona Dell PA-C       Dr. Cristopher Peru, MD  END OF SESSION:  OT End of Session - 02/27/23 2327     Visit Number 2    Number of Visits 12    Date for OT Re-Evaluation 05/01/23    OT Start Time 1618    OT Stop Time 1700    OT Time Calculation (min) 42 min    Activity Tolerance Patient tolerated treatment well    Behavior During Therapy [facility] for tasks assessed/performed                 Past Medical History:  Diagnosis Date   Hypertension    Past Surgical History:  Procedure Laterality Date   COLONOSCOPY N/A 10/13/2013   Note from [facility] GI says to repeat in 5 years   COLONOSCOPY  02/2021   Dr. Norma Fredrickson of [facility]   Patient Active Problem List   Diagnosis Date Noted   Mixed sensory-motor polyneuropathy 06/16/2017    ONSET DATE: September 2024  REFERRING DIAG: Left 3rd Digit (LF) Trigger Finger  THERAPY DIAG:  Muscle weakness (generalized)  Rationale for Evaluation and Treatment: Rehabilitation  SUBJECTIVE:   SUBJECTIVE STATEMENT: Pt. reports not being able to attend therapy over the Holidays. Pt accompanied by: self  PERTINENT HISTORY: Pt. presents with Left 3rd Digit Long Finger Trigger Finger which began 1 approximately 2 months ago. Pt. reports occasional pain, No pain at the initial evaluation. Triggering occurs mainly at night. Of note, Pt. had an ER visit yesterday for a dog bite to the distal tip of the right 2nd digit. Pt. has sutures in place, and was placed on an antibiotic. Pt. has a History of RUE Peripheral Neuropathy.   PRECAUTIONS: None  WEIGHT BEARING RESTRICTIONS: No  PAIN:  Are you having pain? Yes left hand. NR  Pt. Reports having increased triggering in the left 3rd digit 2/2 having to use it more at work since having the Dog bite in the right 2nd digit.   FALLS: Has  patient fallen in last 6 months? No  LIVING ENVIRONMENT: Lives with: lives with their spouse Lives in: House/apartment Stairs: 8 steps to enter Has following equipment at home: None  PLOF: Independent  PATIENT GOALS:  To improve strength  OBJECTIVE:  Note: Objective measures were completed at Evaluation unless otherwise noted.  HAND DOMINANCE: Right  ADLs:  Transfers/ambulation related to ADLs: Eating: Independent Grooming: Independent UB Dressing: Independent LB Dressing: independent Toileting: independent Bathing: independent Tub Shower transfers: independent   IADLs: Independent with IADLs. 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  MOBILITY STATUS: Independent  FUNCTIONAL OUTCOME MEASURES: FOTO: 67 TR score: 73  UPPER EXTREMITY ROM:    Active ROM Right Eval WFL Left Eval [facility]  Shoulder flexion    Shoulder abduction    Shoulder adduction    Shoulder extension    Shoulder internal rotation    Shoulder external rotation    Elbow flexion    Elbow extension    Wrist flexion    Wrist extension    Wrist ulnar deviation    Wrist radial deviation    Wrist pronation    Wrist supination    (Blank rows = not tested)  Left digit flexion, and extension, Abduction/Adduction, is WNL Thumb Opposition is WNL  UPPER EXTREMITY MMT:     MMT Right eval Left eval  Shoulder flexion 5/5 5/5  Shoulder abduction 5/5 5/5  Shoulder adduction    Shoulder extension    Shoulder internal rotation    Shoulder external rotation    Middle trapezius    Lower trapezius    Elbow flexion 5/5 5/5  Elbow extension 5/5 5/5  Wrist flexion    Wrist extension 5/5 5/5  Wrist ulnar deviation    Wrist radial deviation    Wrist pronation    Wrist supination    (Blank rows = not tested)  HAND FUNCTION: Right NT 2/2 new dog bite injury at the distal 2nd digit Grip strength:  Right: NT lbs; Left: 72 lbs, Lateral pinch: Right: NT lbs, Left: 22 lbs, and 3 point pinch: Right: NT lbs, Left: 22 lbs  COORDINATION: Right NT 2/2 new dog bit injury to the distal 2nd digit 9 Hole Peg test: Right: NT sec; Left: 21 sec  SENSATION: WFL to the left, Right NT   EDEMA: N/A    COGNITION: Overall cognitive status: Within functional limits for tasks assessed   TODAY'S TREATMENT:                                                                                                                              DATE: 02/27/2023   Contrast bath:  Pt. tolerated contrasting heat pack for 3 min. followed by cold pack for 1 min. for 3 trials ending with 3 min. of heat for a total of 15 min. to the left hand 2/2 pain, edema, and stiffness. Contrast bath was performed in preparation for manual therapy, and there. Ex.     Manual Therapy:  Pt. Tolerated soft tissue massage to the volar surface of the left 3rd digit, base of the 3rd digit at the volar aspect of the MP,  and the A1 pulley 2/2 stiffness, edema, and pain.   Therapeutic Ex:  Tendon Glide exercises/intrinsic stretches were reviewed for the left hand. Pt. required verbal cues, and cues for visual demonstration  of proper technique, pace, and form.       PATIENT EDUCATION: Education details: Contrast bath heat/cold, Tendon glides, soft tissue massage, Oval-8 splint Person educated: Patient Education method: Explanation, Demonstration, and Verbal cues Education comprehension: verbalized understanding and returned demonstration  HOME EXERCISE PROGRAM: Tendon Glide exercises   GOALS: Goals reviewed with patient? Yes  SHORT TERM GOALS: Target date: 03/19/2023    Pt. Will demonstrate independence with HEPs for the left hand. Baseline: Eval: No current HEP Goal status: INITIAL  LONG TERM GOALS: Target date: 04/30/2023    Pt. Will tolerate splint following recommended wearing schedule, and demonstrate independence with  application, and removal. Baseline: Eval: Pt. was fit for an Oval -8 splint, and demonstrated independence with application/removal Goal status: INITIAL  2.  Pt. will increase FOTO score by 2 points to reflect Pt. perceived improvement with assessment specific ADL/IADL's.  Baseline:  Eval: 67 TR score: 73 Goal status: INITIAL  3.  Pt. Will improve left grip strength by 2# to be able to stabilize, and hold work related items.  Baseline: Eval: Right: NT L: 72# Goal status: INITIAL  4.  Pt. Will improve left hand [facility] skills by 2 sec. to be able to manipulate small objects more efficiently Baseline: Right: NT; Left: 21 sec.  Goal status: INITIAL  5.  Pt. Will recall joint protection principals for ADLs, and IADLs Baseline: Eval: TBD Goal status: INITIAL   ASSESSMENT:  CLINICAL IMPRESSION:  Pt. reports having had an increase in painful triggering of the left 3rd digit since having to use the left hand more for during daily, and work related tasks. Since sustaining the Dog bite injury to his right 2nd digit, Pt. 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 Pt. Education was provided about wearing oval-8 splint at intervals during the day when performing tasks, instead of night time. Pt. reports having an "Activator" used on his left hand during his most recent chiropractic appointment this week. Pt. tolerated the Contrasting heat, and cold as well and manual therapy today, however presents with edema in the proximal 3rd digit, and intermittent discomfort in the left palm with intrinsic stretches. Pt. Education was provided about strategies  for joint protection, contrast bath, and soft tissue massage. Pt. continues to benefit from skilled OT services to improve left hand function, and maximize independence with ADLs, and IADL  tasks.    PERFORMANCE DEFICITS: in functional skills including ADLs, IADLs, coordination, dexterity, proprioception, sensation, ROM, strength, pain, Fine motor control, Gross motor control, decreased knowledge of use of DME, and UE functional use, cognitive skills including , and psychosocial skills including environmental adaptation.   IMPAIRMENTS: are limiting patient from ADLs, IADLs, work, and leisure.   CO-MORBIDITIES: may have co-morbidities  that affects occupational performance. Patient will benefit from skilled OT to address above impairments and improve overall function.  MODIFICATION OR ASSISTANCE TO COMPLETE EVALUATION: Min-Moderate modification of tasks or assist with assess necessary to complete an evaluation.  OT OCCUPATIONAL PROFILE AND HISTORY: Detailed assessment: Review of records and additional review of physical, cognitive, psychosocial history related to current functional performance.  CLINICAL DECISION MAKING: Moderate - several treatment options, min-mod task modification necessary  REHAB POTENTIAL: Good  EVALUATION COMPLEXITY: Moderate    PLAN:  OT FREQUENCY: 1x/week  OT DURATION: 12 weeks  PLANNED INTERVENTIONS: 97535 self care/ADL training, 96045 therapeutic exercise, 97530 therapeutic activity, 97112 neuromuscular re-education, 97140 manual therapy, 97035 ultrasound, 97018 paraffin, 40981 moist heat, 97034 contrast bath, 97760 Orthotics management and training, 19147 Splinting (initial encounter), passive range of motion, energy conservation, coping strategies training, patient/family education, and DME and/or AE instructions  RECOMMENDED OTHER SERVICES: Possible PT referral 2/2 reports of intermittent stumbling.  CONSULTED AND AGREED WITH PLAN OF CARE: Patient  PLAN FOR NEXT SESSION: Treat per POC   Olegario Messier, MS, OTR/L  02/27/2023, 11:29 PM

## 2023-02-27 NOTE — Therapy (Incomplete)
OUTPATIENT OCCUPATIONAL THERAPY EVALUATION  Patient Name: Marcus Riley MRN: [id number] DOB:Aug 23, 1960, 62 y.o., male Today's Date: 02/27/2023   REFERRING PROVIDERS: Nona Dell PA-C       Dr. Cristopher Peru, MD  END OF SESSION:  OT End of Session - 02/27/23 2327     Visit Number 2    Number of Visits 12    Date for OT Re-Evaluation 05/01/23    OT Start Time 1618    OT Stop Time 1700    OT Time Calculation (min) 42 min    Activity Tolerance Patient tolerated treatment well    Behavior During Therapy [facility] for tasks assessed/performed                 Past Medical History:  Diagnosis Date  . Hypertension    Past Surgical History:  Procedure Laterality Date  . COLONOSCOPY N/A 10/13/2013   Note from [facility] says to repeat in 5 years  . COLONOSCOPY  02/2021   Dr. Norma Fredrickson of [facility]   Patient Active Problem List   Diagnosis Date Noted  . Mixed sensory-motor polyneuropathy 06/16/2017    ONSET DATE: September 2024  REFERRING DIAG: Left 3rd Digit (LF) Trigger Finger  THERAPY DIAG:  Muscle weakness (generalized)  Rationale for Evaluation and Treatment: Rehabilitation  SUBJECTIVE:   SUBJECTIVE STATEMENT: Pt. reports not being able to attend therapy over the Holidays. Pt accompanied by: self  PERTINENT HISTORY: Pt. presents with Left 3rd Digit Long Finger Trigger Finger which began 1 approximately 2 months ago. Pt. reports occasional pain, No pain at the initial evaluation. Triggering occurs mainly at night. Of note, Pt. had an ER visit yesterday for a dog bite to the distal tip of the right 2nd digit. Pt. has sutures in place, and was placed on an antibiotic. Pt. has a History of RUE Peripheral Neuropathy.   PRECAUTIONS: None  WEIGHT BEARING RESTRICTIONS: No  PAIN:  Are you having pain? Yes left hand. NR  Pt. Reports having increased triggering in the right 3rd digit 2/2 having to use it more at work since having the Dog bite in the right 2nd digit.   FALLS:  Has patient fallen in last 6 months? No  LIVING ENVIRONMENT: Lives with: lives with their spouse Lives in: House/apartment Stairs: 8 steps to enter Has following equipment at home: None  PLOF: Independent  PATIENT GOALS:  To improve strength  OBJECTIVE:  Note: Objective measures were completed at Evaluation unless otherwise noted.  HAND DOMINANCE: Right  ADLs:  Transfers/ambulation related to ADLs: Eating: Independent Grooming: Independent UB Dressing: Independent LB Dressing: independent Toileting: independent Bathing: independent Tub Shower transfers: independent   IADLs: Independent with IADLs. 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  MOBILITY STATUS: Independent  FUNCTIONAL OUTCOME MEASURES: FOTO: 67 TR score: 73  UPPER EXTREMITY ROM:    Active ROM Right Eval WFL Left Eval [facility]  Shoulder flexion    Shoulder abduction    Shoulder adduction    Shoulder extension    Shoulder internal rotation    Shoulder external rotation    Elbow flexion    Elbow extension    Wrist flexion    Wrist extension    Wrist ulnar deviation    Wrist radial deviation    Wrist pronation    Wrist supination    (Blank rows = not tested)  Left digit flexion, and extension, Abduction/Adduction, is WNL Thumb Opposition is WNL  UPPER EXTREMITY MMT:     MMT Right eval Left eval  Shoulder flexion 5/5 5/5  Shoulder abduction 5/5 5/5  Shoulder adduction    Shoulder extension    Shoulder internal rotation    Shoulder external rotation    Middle trapezius    Lower trapezius    Elbow flexion 5/5 5/5  Elbow extension 5/5 5/5  Wrist flexion    Wrist extension 5/5 5/5  Wrist ulnar deviation    Wrist radial deviation    Wrist pronation    Wrist supination    (Blank rows = not tested)  HAND FUNCTION: Right NT 2/2 new dog bite injury at the distal 2nd digit Grip strength:  Right: NT lbs; Left: 72 lbs, Lateral pinch: Right: NT lbs, Left: 22 lbs, and 3 point pinch: Right: NT lbs, Left: 22 lbs  COORDINATION: Right NT 2/2 new dog bit injury to the distal 2nd digit 9 Hole Peg test: Right: NT sec; Left: 21 sec  SENSATION: WFL to the left, Right NT   EDEMA: N/A    COGNITION: Overall cognitive status: Within functional limits for tasks assessed   TODAY'S TREATMENT:                                                                                                                              DATE: 02/27/2023   Contrast bath:  Pt. tolerated contrasting heat pack for 3 min. followed by cold pack for 1 min. for 3 trials ending with 3 min. of heat for a total of 15 min. to the left hand 2/2 pain, edema, and stiffness. Contrast bath was performed in preparation for manual therapy, and there. Ex.     Manual Therapy:  Pt. Tolerated soft tissue massage to the volar surface of the left 3rd digit, base of the 3rd digit at the volar aspect of the MP,  and the A1 pulley 2/2 stiffness, edema, and pain.   Therapeutic Ex:  Tendon Glide exercises/intrinsic stretches were reviewed for the left hand. Pt. required verbal cues, and cues for visual demonstration  of proper technique, pace, and form.       PATIENT EDUCATION: Education details: Contrast bath heat/cold, Tendon glides, Oval-8 splint Person educated: Patient Education method: Explanation, Demonstration, and Verbal cues Education comprehension: verbalized understanding and returned demonstration  HOME EXERCISE PROGRAM: Tendon Glide exercises   GOALS: Goals reviewed with patient? Yes  SHORT TERM GOALS: Target date: 03/19/2023    Pt. Will demonstrate independence with HEPs for the left hand. Baseline: Eval: No current HEP Goal status: INITIAL  LONG TERM GOALS: Target date: 04/30/2023    Pt. Will tolerate splint following recommended wearing schedule, and demonstrate independence with application, and  removal. Baseline: Eval: Pt. was fit for an Oval -8 splint, and demonstrated independence with application/removal Goal status: INITIAL  2.  Pt. will increase FOTO score by 2 points to reflect Pt. perceived improvement with assessment specific ADL/IADL's.  Baseline: Eval: 67 TR  score: 73 Goal status: INITIAL  3.  Pt. Will improve left grip strength by 2# to be able to stabilize, and hold work related items.  Baseline: Eval: Right: NT L: 72# Goal status: INITIAL  4.  Pt. Will improve left hand [facility] skills by 2 sec. to be able to manipulate small objects more efficiently Baseline: Right: NT; Left: 21 sec.  Goal status: INITIAL  5.  Pt. Will recall joint protection principals for ADLs, and IADLs Baseline: Eval: TBD Goal status: INITIAL   ASSESSMENT:  CLINICAL IMPRESSION:  Pt. Reports having had an increase in painful triggering of the left 3rd digit since having to use the left hand more for during daily, and work related tasks. Since sustaining the Dog bite injury to his right 2nd digit, Pt. 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 Pt. Education was provided about wearing oval-8 splint at intervals during the day when performing tasks, instead of night time. Pt. reports having an "Activator" used on his left hand during his most recent chiropractic appointment this week. Pt. Tolerated the Contrasting heat, and cold as well and manual therapy. Pt. continues to benefit from skilled OT services to improve left hand function, and maximize independence with ADLs, and IADL tasks.    PERFORMANCE DEFICITS: in functional skills including ADLs, IADLs, coordination, dexterity, proprioception, sensation, ROM, strength, pain, Fine motor control, Gross motor control, decreased knowledge of use of DME, and UE functional use,  cognitive skills including , and psychosocial skills including environmental adaptation.   IMPAIRMENTS: are limiting patient from ADLs, IADLs, work, and leisure.   CO-MORBIDITIES: may have co-morbidities  that affects occupational performance. Patient will benefit from skilled OT to address above impairments and improve overall function.  MODIFICATION OR ASSISTANCE TO COMPLETE EVALUATION: Min-Moderate modification of tasks or assist with assess necessary to complete an evaluation.  OT OCCUPATIONAL PROFILE AND HISTORY: Detailed assessment: Review of records and additional review of physical, cognitive, psychosocial history related to current functional performance.  CLINICAL DECISION MAKING: Moderate - several treatment options, min-mod task modification necessary  REHAB POTENTIAL: Good  EVALUATION COMPLEXITY: Moderate    PLAN:  OT FREQUENCY: 1x/week  OT DURATION: 12 weeks  PLANNED INTERVENTIONS: 97535 self care/ADL training, 19147 therapeutic exercise, 97530 therapeutic activity, 97112 neuromuscular re-education, 97140 manual therapy, 97035 ultrasound, 97018 paraffin, 82956 moist heat, 97034 contrast bath, 97760 Orthotics management and training, 21308 Splinting (initial encounter), passive range of motion, energy conservation, coping strategies training, patient/family education, and DME and/or AE instructions  RECOMMENDED OTHER SERVICES: Possible PT referral 2/2 reports of intermittent stumbling.  CONSULTED AND AGREED WITH PLAN OF CARE: Patient  PLAN FOR NEXT SESSION: Treat per POC   Olegario Messier, MS, OTR/L  02/27/2023, 11:29 PM

## 2023-02-28 DIAGNOSIS — M9902 Segmental and somatic dysfunction of thoracic region: Secondary | ICD-10-CM | POA: Diagnosis not present

## 2023-02-28 DIAGNOSIS — M9901 Segmental and somatic dysfunction of cervical region: Secondary | ICD-10-CM | POA: Diagnosis not present

## 2023-02-28 DIAGNOSIS — M9903 Segmental and somatic dysfunction of lumbar region: Secondary | ICD-10-CM | POA: Diagnosis not present

## 2023-02-28 DIAGNOSIS — M9905 Segmental and somatic dysfunction of pelvic region: Secondary | ICD-10-CM | POA: Diagnosis not present

## 2023-03-05 ENCOUNTER — Encounter: Payer: 59 | Admitting: Occupational Therapy

## 2023-03-18 ENCOUNTER — Ambulatory Visit: Payer: 59 | Admitting: Occupational Therapy

## 2023-03-27 ENCOUNTER — Other Ambulatory Visit: Payer: Self-pay | Admitting: Physician Assistant

## 2023-04-08 DIAGNOSIS — M9905 Segmental and somatic dysfunction of pelvic region: Secondary | ICD-10-CM | POA: Diagnosis not present

## 2023-04-08 DIAGNOSIS — M9902 Segmental and somatic dysfunction of thoracic region: Secondary | ICD-10-CM | POA: Diagnosis not present

## 2023-04-08 DIAGNOSIS — M9901 Segmental and somatic dysfunction of cervical region: Secondary | ICD-10-CM | POA: Diagnosis not present

## 2023-04-08 DIAGNOSIS — M9903 Segmental and somatic dysfunction of lumbar region: Secondary | ICD-10-CM | POA: Diagnosis not present

## 2023-04-09 DIAGNOSIS — M9901 Segmental and somatic dysfunction of cervical region: Secondary | ICD-10-CM | POA: Diagnosis not present

## 2023-04-09 DIAGNOSIS — M9903 Segmental and somatic dysfunction of lumbar region: Secondary | ICD-10-CM | POA: Diagnosis not present

## 2023-04-09 DIAGNOSIS — M9905 Segmental and somatic dysfunction of pelvic region: Secondary | ICD-10-CM | POA: Diagnosis not present

## 2023-04-09 DIAGNOSIS — M9902 Segmental and somatic dysfunction of thoracic region: Secondary | ICD-10-CM | POA: Diagnosis not present

## 2023-04-11 DIAGNOSIS — M9903 Segmental and somatic dysfunction of lumbar region: Secondary | ICD-10-CM | POA: Diagnosis not present

## 2023-04-11 DIAGNOSIS — M9901 Segmental and somatic dysfunction of cervical region: Secondary | ICD-10-CM | POA: Diagnosis not present

## 2023-04-11 DIAGNOSIS — M9902 Segmental and somatic dysfunction of thoracic region: Secondary | ICD-10-CM | POA: Diagnosis not present

## 2023-04-11 DIAGNOSIS — M9905 Segmental and somatic dysfunction of pelvic region: Secondary | ICD-10-CM | POA: Diagnosis not present

## 2023-04-22 DIAGNOSIS — M9902 Segmental and somatic dysfunction of thoracic region: Secondary | ICD-10-CM | POA: Diagnosis not present

## 2023-04-22 DIAGNOSIS — M9903 Segmental and somatic dysfunction of lumbar region: Secondary | ICD-10-CM | POA: Diagnosis not present

## 2023-04-22 DIAGNOSIS — M9905 Segmental and somatic dysfunction of pelvic region: Secondary | ICD-10-CM | POA: Diagnosis not present

## 2023-04-22 DIAGNOSIS — M9901 Segmental and somatic dysfunction of cervical region: Secondary | ICD-10-CM | POA: Diagnosis not present

## 2023-04-25 DIAGNOSIS — M9901 Segmental and somatic dysfunction of cervical region: Secondary | ICD-10-CM | POA: Diagnosis not present

## 2023-04-25 DIAGNOSIS — M9903 Segmental and somatic dysfunction of lumbar region: Secondary | ICD-10-CM | POA: Diagnosis not present

## 2023-04-25 DIAGNOSIS — M9902 Segmental and somatic dysfunction of thoracic region: Secondary | ICD-10-CM | POA: Diagnosis not present

## 2023-04-25 DIAGNOSIS — M9905 Segmental and somatic dysfunction of pelvic region: Secondary | ICD-10-CM | POA: Diagnosis not present

## 2023-05-06 ENCOUNTER — Other Ambulatory Visit: Payer: Self-pay

## 2023-05-06 DIAGNOSIS — R051 Acute cough: Secondary | ICD-10-CM

## 2023-05-06 LAB — POCT INFLUENZA A/B
Influenza A, POC: NEGATIVE
Influenza B, POC: NEGATIVE

## 2023-05-06 LAB — POC COVID19 BINAXNOW: SARS Coronavirus 2 Ag: NEGATIVE

## 2023-05-06 NOTE — Progress Notes (Signed)
S/Sx started yesterday: Cough - dry - worse at night - lying down starts hacking longer & more frequently Ears clicking - not painful Mild sore throat - able to swallow Nasal drainage - clear mucus SOB with activity Denies fever  Using OTC Robitussin cough syrup  No flu vaccine this year  States grandchildren had flu last week & he was around them Co-workers - he heard some have had flu or covid  Temp = 98.3  Informed Marcus Riley that Covid, Influenza A & B are negative. Advised him to take Allegra D bid for 7-10 day & continue using OTC Robitussin for cough.  If S/Sx worsen or aren't getting any better to follow-up.  Verbalized understanding.

## 2023-05-27 DIAGNOSIS — M9905 Segmental and somatic dysfunction of pelvic region: Secondary | ICD-10-CM | POA: Diagnosis not present

## 2023-05-27 DIAGNOSIS — M9903 Segmental and somatic dysfunction of lumbar region: Secondary | ICD-10-CM | POA: Diagnosis not present

## 2023-05-27 DIAGNOSIS — M9901 Segmental and somatic dysfunction of cervical region: Secondary | ICD-10-CM | POA: Diagnosis not present

## 2023-05-27 DIAGNOSIS — M9902 Segmental and somatic dysfunction of thoracic region: Secondary | ICD-10-CM | POA: Diagnosis not present

## 2023-05-30 DIAGNOSIS — M9905 Segmental and somatic dysfunction of pelvic region: Secondary | ICD-10-CM | POA: Diagnosis not present

## 2023-05-30 DIAGNOSIS — M9902 Segmental and somatic dysfunction of thoracic region: Secondary | ICD-10-CM | POA: Diagnosis not present

## 2023-05-30 DIAGNOSIS — M9901 Segmental and somatic dysfunction of cervical region: Secondary | ICD-10-CM | POA: Diagnosis not present

## 2023-05-30 DIAGNOSIS — M9903 Segmental and somatic dysfunction of lumbar region: Secondary | ICD-10-CM | POA: Diagnosis not present

## 2023-06-10 DIAGNOSIS — J019 Acute sinusitis, unspecified: Secondary | ICD-10-CM | POA: Diagnosis not present

## 2023-06-25 DIAGNOSIS — M9903 Segmental and somatic dysfunction of lumbar region: Secondary | ICD-10-CM | POA: Diagnosis not present

## 2023-06-25 DIAGNOSIS — M9905 Segmental and somatic dysfunction of pelvic region: Secondary | ICD-10-CM | POA: Diagnosis not present

## 2023-06-25 DIAGNOSIS — M9901 Segmental and somatic dysfunction of cervical region: Secondary | ICD-10-CM | POA: Diagnosis not present

## 2023-06-25 DIAGNOSIS — M9902 Segmental and somatic dysfunction of thoracic region: Secondary | ICD-10-CM | POA: Diagnosis not present

## 2023-06-26 DIAGNOSIS — J019 Acute sinusitis, unspecified: Secondary | ICD-10-CM | POA: Diagnosis not present

## 2023-06-30 ENCOUNTER — Other Ambulatory Visit: Payer: Self-pay

## 2023-06-30 ENCOUNTER — Ambulatory Visit: Payer: Self-pay

## 2023-06-30 DIAGNOSIS — Z0184 Encounter for antibody response examination: Secondary | ICD-10-CM

## 2023-06-30 DIAGNOSIS — G4700 Insomnia, unspecified: Secondary | ICD-10-CM

## 2023-06-30 DIAGNOSIS — Z0000 Encounter for general adult medical examination without abnormal findings: Secondary | ICD-10-CM

## 2023-06-30 LAB — POCT URINALYSIS DIPSTICK
Bilirubin, UA: NEGATIVE
Blood, UA: NEGATIVE
Glucose, UA: NEGATIVE
Ketones, UA: NEGATIVE
Leukocytes, UA: NEGATIVE
Nitrite, UA: NEGATIVE
Protein, UA: NEGATIVE
Spec Grav, UA: 1.015 (ref 1.010–1.025)
Urobilinogen, UA: 0.2 U/dL
pH, UA: 6 (ref 5.0–8.0)

## 2023-06-30 MED ORDER — ESZOPICLONE 1 MG PO TABS: 1.0000 mg | ORAL_TABLET | Freq: Every evening | ORAL | 0 refills | Status: DC | PRN

## 2023-06-30 NOTE — Progress Notes (Signed)
Fasting labs, urine and EKG obtained pre physical for COB.

## 2023-07-10 LAB — CMP12+LP+TP+TSH+6AC+PSA+CBC…
ALT: 64 IU/L — ABNORMAL HIGH (ref 0–44)
AST: 48 IU/L — ABNORMAL HIGH (ref 0–40)
Albumin: 4.1 g/dL (ref 3.9–4.9)
Alkaline Phosphatase: 117 IU/L (ref 44–121)
BUN/Creatinine Ratio: 14 (ref 10–24)
BUN: 12 mg/dL (ref 8–27)
Basophils Absolute: 0 10*3/uL (ref 0.0–0.2)
Basos: 1 %
Bilirubin Total: 0.5 mg/dL (ref 0.0–1.2)
Calcium: 10 mg/dL (ref 8.6–10.2)
Chloride: 107 mmol/L — ABNORMAL HIGH (ref 96–106)
Chol/HDL Ratio: 2.8 ratio (ref 0.0–5.0)
Cholesterol, Total: 173 mg/dL (ref 100–199)
Creatinine, Ser: 0.87 mg/dL (ref 0.76–1.27)
EOS (ABSOLUTE): 0.2 10*3/uL (ref 0.0–0.4)
Eos: 4 %
Estimated CHD Risk: <0.5 times avg. (ref 0.0–1.0)
Free Thyroxine Index: 1.9 (ref 1.2–4.9)
GGT: 106 IU/L — ABNORMAL HIGH (ref 0–65)
Globulin, Total: 1.9 g/dL (ref 1.5–4.5)
Glucose: 89 mg/dL (ref 70–99)
HDL: 61 mg/dL (ref >39)
Hematocrit: 41.5 % (ref 37.5–51.0)
Hemoglobin: 13.6 g/dL (ref 13.0–17.7)
Immature Grans (Abs): 0 10*3/uL (ref 0.0–0.1)
Immature Granulocytes: 0 %
Iron: 219 ug/dL — ABNORMAL HIGH (ref 38–169)
LDH: 184 IU/L (ref 121–224)
LDL Chol Calc (NIH): 99 mg/dL (ref 0–99)
Lymphocytes Absolute: 1.4 10*3/uL (ref 0.7–3.1)
Lymphs: 29 %
MCH: 29.2 pg (ref 26.6–33.0)
MCHC: 32.8 g/dL (ref 31.5–35.7)
MCV: 89 fL (ref 79–97)
Monocytes Absolute: 0.5 10*3/uL (ref 0.1–0.9)
Monocytes: 11 %
Neutrophils Absolute: 2.5 10*3/uL (ref 1.4–7.0)
Neutrophils: 55 %
Phosphorus: 2.6 mg/dL — ABNORMAL LOW (ref 2.8–4.1)
Platelets: 138 10*3/uL — ABNORMAL LOW (ref 150–450)
Potassium: 4.9 mmol/L (ref 3.5–5.2)
Prostate Specific Ag, Serum: 1.5 ng/mL (ref 0.0–4.0)
RBC: 4.66 x10E6/uL (ref 4.14–5.80)
RDW: 12.9 % (ref 11.6–15.4)
Sodium: 142 mmol/L (ref 134–144)
T3 Uptake Ratio: 28 % (ref 24–39)
T4, Total: 6.7 ug/dL (ref 4.5–12.0)
TSH: 2.16 u[IU]/mL (ref 0.450–4.500)
Total Protein: 6 g/dL (ref 6.0–8.5)
Triglycerides: 68 mg/dL (ref 0–149)
Uric Acid: 5.9 mg/dL (ref 3.8–8.4)
VLDL Cholesterol Cal: 13 mg/dL (ref 5–40)
WBC: 4.6 10*3/uL (ref 3.4–10.8)
eGFR: 98 mL/min/{1.73_m2} (ref >59)

## 2023-07-10 LAB — RABIES NEUT.ABS TITRAT.(RFFIT)

## 2023-07-11 DIAGNOSIS — N529 Male erectile dysfunction, unspecified: Secondary | ICD-10-CM | POA: Insufficient documentation

## 2023-07-11 DIAGNOSIS — Z9109 Other allergy status, other than to drugs and biological substances: Secondary | ICD-10-CM | POA: Insufficient documentation

## 2023-07-11 DIAGNOSIS — K219 Gastro-esophageal reflux disease without esophagitis: Secondary | ICD-10-CM | POA: Insufficient documentation

## 2023-07-11 DIAGNOSIS — G4700 Insomnia, unspecified: Secondary | ICD-10-CM | POA: Insufficient documentation

## 2023-07-11 DIAGNOSIS — I10 Essential (primary) hypertension: Secondary | ICD-10-CM | POA: Insufficient documentation

## 2023-07-14 ENCOUNTER — Encounter: Payer: Self-pay | Admitting: Physician Assistant

## 2023-07-14 ENCOUNTER — Other Ambulatory Visit: Payer: Self-pay

## 2023-07-14 ENCOUNTER — Ambulatory Visit: Payer: Self-pay | Admitting: Physician Assistant

## 2023-07-14 VITALS — BP 138/84 | HR 82 | Temp 97.7°F | Resp 16 | Wt 235.0 lb

## 2023-07-14 DIAGNOSIS — G4700 Insomnia, unspecified: Secondary | ICD-10-CM

## 2023-07-14 DIAGNOSIS — Z7185 Encounter for immunization safety counseling: Secondary | ICD-10-CM

## 2023-07-14 DIAGNOSIS — Z0184 Encounter for antibody response examination: Secondary | ICD-10-CM

## 2023-07-14 NOTE — Progress Notes (Signed)
 [facility] [place] [facility] ____________________________________________   None    (approximate)  I have reviewed the triage vital signs and the nursing notes.   HISTORY  Chief Complaint No chief complaint on file.   HPI Marcus Riley is a 63 y.o. male patient presents for annual physical exam.  Patient states that in my absence he was given a prescription for Lunesta  1 mg.  Patient normally takes 2 mg.  Advised patient to double the dosage since he take it as needed.  Will refill when current prescription is finished.     Past Medical History:  Diagnosis Date   Hypertension     Patient Active Problem List   Diagnosis Date Noted   Insomnia 07/11/2023   GERD (gastroesophageal reflux disease) 07/11/2023   Environmental allergies 07/11/2023   Hypertension 07/11/2023   ED (erectile dysfunction) 07/11/2023   Mixed sensory-motor polyneuropathy 06/16/2017    Past Surgical History:  Procedure Laterality Date   COLONOSCOPY N/A 10/13/2013   Note from [facility] says to repeat in 5 years   COLONOSCOPY  02/2021   Dr. Corky Diener of [facility]    Prior to Admission medications   Medication Sig Start Date End Date Taking? Authorizing Provider  DOCUSATE SODIUM PO Take by mouth.    [provider]  eszopiclone  (LUNESTA ) 1 MG TABS tablet Take 1 tablet (1 mg total) by mouth at bedtime as needed for sleep. Take immediately before bedtime 06/30/23   Guadelupe Leech, PA-C  famotidine (PEPCID) 10 MG tablet Take 1 tablet by mouth 2 (two) times daily.    [provider]  fluticasone (FLONASE) 50 MCG/ACT nasal spray Place 2 sprays into both nostrils daily. 09/17/21   [provider]  gabapentin (NEURONTIN) 300 MG capsule Take 2 capsules by mouth at bedtime. 10/02/21 10/03/22  [provider]  lisinopril  (ZESTRIL ) 20 MG tablet TAKE 1 TABLET BY MOUTH EVERY DAY 03/27/23   Phyliss Hulick K, PA-C  melatonin 5 MG TABS Take 1 tablet by mouth daily as needed.     [provider]  sildenafil  (VIAGRA ) 25 MG tablet Take 1-4 tablets by mouth 1hr prior to intercourse 09/23/22   Stoioff, Kizzie Perks, MD    Allergies Egg-derived products  Family History  Problem Relation Age of Onset   Asthma Mother     Social History Social History   Tobacco Use   Smoking status: Never    Passive exposure: Never   Smokeless tobacco: Never  Substance Use Topics   Alcohol use: Yes    Comment: rare   Drug use: Never    Review of Systems Constitutional: No fever/chills Eyes: No visual changes. ENT: No sore throat. Cardiovascular: Denies chest pain. Respiratory: Denies shortness of breath. Gastrointestinal: No abdominal pain.  No nausea, no vomiting.  No diarrhea.  No constipation. Genitourinary: Negative for dysuria.  Erectile dysfunction Musculoskeletal: Negative for back pain. Skin: Negative for rash. Neurological: Negative for headaches, focal weakness or numbness.  Endocrine: Hypertension  Allergic/Immunilogical: Egg products ____________________________________________   PHYSICAL EXAM:  VITAL SIGNS: BP 138/84  Pulse Rate 82  Temp 97.7 F (36.5 C)  Weight 235 lb (106.6 kg)  Resp 16  SpO2 97 %   BMI: 33.72 kg/m2  BSA: 2.29 m2   Constitutional: Alert and oriented. Well appearing and in no acute distress. Eyes: Conjunctivae are normal. PERRL. EOMI. Head: Atraumatic. Nose: Allergic rhinitis mouth/Throat: Mucous membranes are moist.  Oropharynx non-erythematous. Neck: No stridor.  No cervical spine tenderness to palpation.  Hematological/Lymphatic/Immunilogical: No cervical lymphadenopathy. Cardiovascular: Normal rate, regular rhythm. Grossly normal heart sounds.  Good peripheral circulation. Respiratory: Normal respiratory effort.  No retractions. Lungs CTAB. Gastrointestinal: Soft and nontender. No distention. No abdominal bruits. No CVA tenderness. Genitourinary: Deferred Musculoskeletal: No lower extremity tenderness nor edema.   No joint effusions. Neurologic:  Normal speech and language. No gross focal neurologic deficits are appreciated. No gait instability. Skin:  Skin is warm, dry and intact. No rash noted. Psychiatric: Mood and affect are normal. Speech and behavior are normal.  ____________________________________________   LABS __        Component Ref Range & Units (hover) 2 wk ago 10 mo ago 1 yr ago 2 yr ago 3 yr ago  Color, UA yellow yellow yello yellow Light Yellow  Clarity, UA clear clear clear clear Clear  Glucose, UA Negative Negative Negative Negative Negative  Bilirubin, UA neg neg neg negative Negative  Ketones, UA neg neg neg negative Negative  Spec Grav, UA 1.015 1.015 1.015 1.015 1.010  Blood, UA neg neg neg negative Negative  pH, UA 6.0 7.0 8.0 6.0 7.5  Protein, UA Negative Negative Negative Negative Negative  Urobilinogen, UA 0.2 0.2 0.2 0.2 0.2  Nitrite, UA neg neg neg negative Negative  Leukocytes, UA Negative Negative Negative Negative Negative  Appearance   dark light   Odor   none               View All Conversations on this Encounter              Component Ref Range & Units (hover) 2 wk ago (06/30/23) 10 mo ago (08/19/22) 1 yr ago (05/17/22) 1 yr ago (09/03/21) 2 yr ago (08/31/20) 3 yr ago (08/27/19)  Glucose 89 91  93 82 R 93 R  Uric Acid 5.9 6.7 CM  5.8 CM 6.2 CM 6.7 CM  Comment:            Therapeutic target for gout patients: <6.0  BUN 12 11  11  R 12 R 12 R  Creatinine, Ser 0.87 0.91  0.78 0.88 0.89  eGFR 98 96  103 100   BUN/Creatinine Ratio 14 12  14  R 14 R 13 R  Sodium 142 140  140 134 138  Potassium 4.9 4.7  4.4 4.2 4.4  Chloride 107 High  107 High   107 High  99 104  Calcium 10.0 10.0  10.0 R 9.7 R 10.0 R  Phosphorus 2.6 Low  2.4 Low   2.7 Low  2.9 2.8  Total Protein 6.0 6.4  6.2 6.2 6.5  Albumin 4.1 4.4 R  4.3 R 4.4 R 4.4 R  Globulin, Total 1.9 2.0  1.9 1.8 2.1  Bilirubin Total 0.5 0.4  0.2 0.5 0.4  Alkaline Phosphatase 117 104  105 100 118 R  LDH 184  165  160 155 156  AST 48 High  24  21 26 22   ALT 64 High  29  22 23 22   GGT 106 High  61  56 61 56  Iron 219 High  172 High   90 115 122  Cholesterol, Total 173 172  157 163 181  Triglycerides 68 93  49 93 102  HDL 61 46  55 46 44  VLDL Cholesterol Cal 13 17  10 17 19   LDL Chol Calc (NIH) 99 109 High   92 100 High  118 High   Chol/HDL Ratio 2.8 3.7 CM  2.9 CM 3.5 CM 4.1 CM  Comment:                                   T. Chol/HDL Ratio                                             Men  Women                               1/2 Avg.Risk  3.4    3.3                                   Avg.Risk  5.0    4.4                                2X Avg.Risk  9.6    7.1                                3X Avg.Risk 23.4   11.0  Estimated CHD Risk  < 0.5 0.6 CM   < 0.5 CM 0.6 CM 0.8 CM  Comment: The CHD Risk is based on the T. Chol/HDL ratio. Other factors affect CHD Risk such as hypertension, smoking, diabetes, severe obesity, and family history of premature CHD.  TSH 2.160 1.840  2.300 1.850 1.860  T4, Total 6.7 6.6  6.2 7.1 6.8  T3 Uptake Ratio 28 28  25 28 28   Free Thyroxine Index 1.9 1.8  1.6 2.0 1.9  Prostate Specific Ag, Serum 1.5 1.6 CM 1.2 CM 1.1 CM 1.4 CM 4.3 High  CM  Comment: Roche ECLIA methodology. According to the American Urological Association, Serum PSA should decrease and remain at undetectable levels after radical prostatectomy. The AUA defines biochemical recurrence as an initial PSA value 0.2 ng/mL or greater followed by a subsequent confirmatory PSA value 0.2 ng/mL or greater. Values obtained with different assay methods or kits cannot be used interchangeably. Results cannot be interpreted as absolute evidence of the presence or absence of malignant disease.  WBC 4.6 5.1  4.8 5.2 6.7  RBC 4.66 4.99  4.99 4.55 5.23  Hemoglobin 13.6 14.3  14.5 12.9 Low  14.9  Hematocrit 41.5 43.3  42.9 38.6 44.8  MCV 89 87  86 85 86  MCH 29.2 28.7  29.1 28.4 28.5  MCHC 32.8 33.0  33.8 33.4 33.3   RDW 12.9 12.9  12.8 13.2 12.9  Platelets 138 Low  177  173 183 201  Neutrophils 55 58  51 59 57  Lymphs 29 28  32 29 27  Monocytes 11 8  10 9 11   Eos 4 5  6 3 4   Basos 1 1  1  0 1  Neutrophils Absolute 2.5 3.0  2.4 3.0 3.8  Lymphocytes Absolute 1.4 1.4  1.5 1.5 1.8  Monocytes Absolute 0.5 0.4  0.5 0.5 0.7  EOS (ABSOLUTE) 0.2 0.3  0.3 0.2 0.3  Basophils Absolute 0.0 0.0  0.0 0.0 0.0  Immature Granulocytes 0 0  0 0 0  Immature Grans (Abs) 0.0 0.0  0.0 0.0 0.0  Resulting Agency [facility]          View All Conversations on this Encounter       Rabies Neut.Abs Titrat.(RFFIT)         Component Ref Range & Units (hover) 2 wk ago 1 yr ago 3 yr ago  Rabies Titer - Response >/=0.1 >/=0.5 CM <0.1 CM  Comment: Less than 0.1 IU/mL: Below detection limit >/= 0.1 IU/mL:Above detection limit but below 0.5 IU/mL >/= 0.5 IU/mL: Equal to or above 0.5 IU/mL In humans, a result of 0.5 IU/mL or higher is considered an acceptable response to rabies vaccination according to the World Health Organization [facility]) guidelines; see WHO and Advisory committee on [employer] documents for additional guidance. Also,there is more information at bitchilla.com. (Note: the symbol ">" means "greater than" and ">/=" means "greater than or equal to")         __________________________________________  EKG  Sinus rhythm at 67 bpm.  Left fascicular block. ____________________________________________    ____________________________________________   INITIAL IMPRESSION / ASSESSMENT AND PLAN2 As part of my medical decision making, I reviewed the following data within the electronic MEDICAL RECORD NUMBER      No acute findings on physical exam, labs, or EKG.        ____________________________________________   FINAL CLINICAL IMPRESSION Well exam   ED Discharge Orders     None        Note:  This document was prepared using  Dragon voice recognition software and may include unintentional dictation errors.

## 2023-07-14 NOTE — Progress Notes (Signed)
 Presents for yearly physical and works at [employer] with preventative rabies vaccine  due and administered after consent.  Denies any complaints.

## 2023-07-17 ENCOUNTER — Other Ambulatory Visit: Payer: Self-pay

## 2023-07-17 DIAGNOSIS — G4700 Insomnia, unspecified: Secondary | ICD-10-CM

## 2023-07-17 MED ORDER — ESZOPICLONE 2 MG PO TABS: 2.0000 mg | ORAL_TABLET | Freq: Every evening | ORAL | 3 refills | Status: AC | PRN

## 2023-07-17 NOTE — Telephone Encounter (Signed)
 Marcus Riley left a message stating Marcus Leech, Marcus Riley Marcus Riley) didn't fill his Rx correctly.  States she only ordered 1 mg tabs & he takes 2 mg tabs and she only ordered 15 tablets and he usually gets 30 tablets.  Marcus Riley said he discussed the corrections to his Rx with Marcus Clas, Marcus Riley when he was here on 07/14/2023 for his annual physical.  States he contacted his pharmacy ([facility] [place]) and they informed him they haven't received a Rx.  Will put Rx into Epic & send to Marcus Clas, Marcus Riley.

## 2023-07-31 DIAGNOSIS — J329 Chronic sinusitis, unspecified: Secondary | ICD-10-CM | POA: Diagnosis not present

## 2023-07-31 DIAGNOSIS — J324 Chronic pansinusitis: Secondary | ICD-10-CM | POA: Diagnosis not present

## 2023-08-01 DIAGNOSIS — M9903 Segmental and somatic dysfunction of lumbar region: Secondary | ICD-10-CM | POA: Diagnosis not present

## 2023-08-01 DIAGNOSIS — M9902 Segmental and somatic dysfunction of thoracic region: Secondary | ICD-10-CM | POA: Diagnosis not present

## 2023-08-01 DIAGNOSIS — M9901 Segmental and somatic dysfunction of cervical region: Secondary | ICD-10-CM | POA: Diagnosis not present

## 2023-08-01 DIAGNOSIS — M9905 Segmental and somatic dysfunction of pelvic region: Secondary | ICD-10-CM | POA: Diagnosis not present

## 2023-08-18 ENCOUNTER — Other Ambulatory Visit

## 2023-08-25 ENCOUNTER — Other Ambulatory Visit: Payer: Self-pay

## 2023-08-25 DIAGNOSIS — Z0184 Encounter for antibody response examination: Secondary | ICD-10-CM

## 2023-08-25 NOTE — Progress Notes (Signed)
 Pt presents today to complete rabies titer. Marcus Riley

## 2023-08-26 DIAGNOSIS — J329 Chronic sinusitis, unspecified: Secondary | ICD-10-CM | POA: Diagnosis not present

## 2023-08-29 DIAGNOSIS — M9902 Segmental and somatic dysfunction of thoracic region: Secondary | ICD-10-CM | POA: Diagnosis not present

## 2023-08-29 DIAGNOSIS — M9903 Segmental and somatic dysfunction of lumbar region: Secondary | ICD-10-CM | POA: Diagnosis not present

## 2023-08-29 DIAGNOSIS — M9905 Segmental and somatic dysfunction of pelvic region: Secondary | ICD-10-CM | POA: Diagnosis not present

## 2023-08-29 DIAGNOSIS — M9901 Segmental and somatic dysfunction of cervical region: Secondary | ICD-10-CM | POA: Diagnosis not present

## 2023-09-01 DIAGNOSIS — M9903 Segmental and somatic dysfunction of lumbar region: Secondary | ICD-10-CM | POA: Diagnosis not present

## 2023-09-01 DIAGNOSIS — M9902 Segmental and somatic dysfunction of thoracic region: Secondary | ICD-10-CM | POA: Diagnosis not present

## 2023-09-01 DIAGNOSIS — M9905 Segmental and somatic dysfunction of pelvic region: Secondary | ICD-10-CM | POA: Diagnosis not present

## 2023-09-01 DIAGNOSIS — M9901 Segmental and somatic dysfunction of cervical region: Secondary | ICD-10-CM | POA: Diagnosis not present

## 2023-09-03 DIAGNOSIS — G608 Other hereditary and idiopathic neuropathies: Secondary | ICD-10-CM | POA: Diagnosis not present

## 2023-09-03 DIAGNOSIS — M545 Low back pain, unspecified: Secondary | ICD-10-CM | POA: Diagnosis not present

## 2023-09-03 DIAGNOSIS — Z1331 Encounter for screening for depression: Secondary | ICD-10-CM | POA: Diagnosis not present

## 2023-09-05 LAB — RABIES NEUT.ABS TITRAT.(RFFIT)

## 2023-09-08 ENCOUNTER — Encounter: Payer: Self-pay | Admitting: Urology

## 2023-09-08 ENCOUNTER — Ambulatory Visit (INDEPENDENT_AMBULATORY_CARE_PROVIDER_SITE_OTHER): Admitting: Urology

## 2023-09-08 DIAGNOSIS — N5201 Erectile dysfunction due to arterial insufficiency: Secondary | ICD-10-CM | POA: Diagnosis not present

## 2023-09-08 MED ORDER — SILDENAFIL CITRATE 25 MG PO TABS: ORAL_TABLET | ORAL | 0 refills | Status: AC

## 2023-09-08 NOTE — Progress Notes (Unsigned)
    09/08/2023  3:15 PM   [name] Mar 09, 1961 [id number]  Referring provider: Claudene Tanda POUR, PA-C [address] [place],  [place] [address]  Chief Complaint  Patient presents with   Erectile Dysfunction    HPI: Marcus Riley is a 63 y.o. male who presents for follow-up visit.  At last year's visit given a trial of sildenafil .  He requested starting at a lower dose and was given 25 mg tablets.  He has titrated these to 100 mg which has been effective No bothersome LUTS Denies dysuria, gross hematuria Denies flank, abdominal or pelvic pain  PSA 06/2023 was stable at 1.5   PMH: Past Medical History:  Diagnosis Date   Hypertension     Surgical History: Past Surgical History:  Procedure Laterality Date   COLONOSCOPY N/A 10/13/2013   Note from [facility] says to repeat in 5 years   COLONOSCOPY  02/2021   Dr. Aundria of [facility] [place] [facility] Medications:  Allergies as of 09/08/2023       Reactions   Egg-derived Products    Other reaction(s): Unknown        Medication List        Accurate as of September 08, 2023  3:15 PM. If you have any questions, ask your nurse or doctor.          DOCUSATE SODIUM PO Take by mouth.   eszopiclone  2 MG Tabs tablet Commonly known as: LUNESTA  Take 1 tablet (2 mg total) by mouth at bedtime as needed for sleep. Take immediately before bedtime   famotidine 10 MG tablet Commonly known as: PEPCID Take 1 tablet by mouth 2 (two) times daily.   fluticasone 50 MCG/ACT nasal spray Commonly known as: FLONASE Place 2 sprays into both nostrils daily.   gabapentin 300 MG capsule Commonly known as: NEURONTIN Take 2 capsules by mouth at bedtime.   lisinopril  20 MG tablet Commonly known as: ZESTRIL  TAKE 1 TABLET BY MOUTH EVERY DAY   melatonin 5 MG Tabs Take 1 tablet by mouth daily as needed.   sildenafil  25 MG tablet Commonly known as: VIAGRA  Take 1-4 tablets by mouth 1hr prior to intercourse        Allergies:  Allergies   Allergen Reactions   Egg-Derived Products     Other reaction(s): Unknown    Family History: Family History  Problem Relation Age of Onset   Asthma Mother     Social History:  reports that he has never smoked. He has never been exposed to tobacco smoke. He has never used smokeless tobacco. He reports current alcohol use. He reports that he does not use drugs.   Physical Exam: BP 102/65   Pulse 89   Ht 5' 10 (1.778 m)   Wt 230 lb (104.3 kg)   BMI 33.00 kg/m   Constitutional:  Alert and oriented, No acute distress. HEENT:  AT Respiratory: Normal respiratory effort, no increased work of breathing. Psychiatric: Normal mood and affect.   Assessment & Plan:    Erectile dysfunction Sildenafil  has been effective at the 100 mg dose.  He did not desire a 100 mg Rx and requested 25 mg dosing.  Rx was sent to pharmacy Continue annual follow-up   Marcus JAYSON Barba, MD  [facility] [address] [place], [place] [address] [phone]

## 2023-11-05 ENCOUNTER — Other Ambulatory Visit: Payer: Self-pay | Admitting: Urology

## 2023-11-05 DIAGNOSIS — N5201 Erectile dysfunction due to arterial insufficiency: Secondary | ICD-10-CM

## 2023-12-31 ENCOUNTER — Other Ambulatory Visit: Payer: Self-pay

## 2023-12-31 MED ORDER — LISINOPRIL 20 MG PO TABS: 20.0000 mg | ORAL_TABLET | Freq: Every day | ORAL | 2 refills | Status: AC

## 2024-09-07 ENCOUNTER — Ambulatory Visit: Admitting: Urology
